# Patient Record
Sex: Female | Born: 1950 | Race: White | Hispanic: No | Marital: Married | Smoking: Former smoker
Health system: Southern US, Community
[De-identification: ages and names within clinical notes are randomized; demographics above are authoritative.]

## PROBLEM LIST (undated history)

## (undated) DIAGNOSIS — C189 Malignant neoplasm of colon, unspecified: Secondary | ICD-10-CM

## (undated) DIAGNOSIS — I10 Essential (primary) hypertension: Secondary | ICD-10-CM

## (undated) DIAGNOSIS — Z889 Allergy status to unspecified drugs, medicaments and biological substances status: Secondary | ICD-10-CM

## (undated) DIAGNOSIS — F419 Anxiety disorder, unspecified: Secondary | ICD-10-CM

## (undated) DIAGNOSIS — C801 Malignant (primary) neoplasm, unspecified: Secondary | ICD-10-CM

## (undated) DIAGNOSIS — N189 Chronic kidney disease, unspecified: Secondary | ICD-10-CM

## (undated) DIAGNOSIS — C786 Secondary malignant neoplasm of retroperitoneum and peritoneum: Secondary | ICD-10-CM

## (undated) DIAGNOSIS — M1990 Unspecified osteoarthritis, unspecified site: Secondary | ICD-10-CM

## (undated) DIAGNOSIS — R002 Palpitations: Secondary | ICD-10-CM

## (undated) DIAGNOSIS — K219 Gastro-esophageal reflux disease without esophagitis: Secondary | ICD-10-CM

## (undated) HISTORY — PX: BREAST SURGERY: SHX581

## (undated) HISTORY — PX: ABDOMINAL HYSTERECTOMY: SHX81

## (undated) HISTORY — DX: Malignant neoplasm of colon, unspecified: C18.9

---

## 2011-06-22 HISTORY — PX: APPENDECTOMY: SHX54

## 2011-08-18 ENCOUNTER — Other Ambulatory Visit (HOSPITAL_COMMUNITY): Payer: Self-pay | Admitting: Hematology & Oncology

## 2011-08-18 HISTORY — PX: COLON SURGERY: SHX602

## 2011-11-16 ENCOUNTER — Encounter: Payer: Self-pay | Admitting: Internal Medicine

## 2011-11-16 DIAGNOSIS — C189 Malignant neoplasm of colon, unspecified: Secondary | ICD-10-CM

## 2011-11-16 DIAGNOSIS — R109 Unspecified abdominal pain: Secondary | ICD-10-CM

## 2012-02-10 ENCOUNTER — Encounter: Payer: Self-pay | Admitting: Internal Medicine

## 2013-10-19 ENCOUNTER — Other Ambulatory Visit (HOSPITAL_COMMUNITY): Payer: Self-pay | Admitting: Family Medicine

## 2013-10-19 DIAGNOSIS — C189 Malignant neoplasm of colon, unspecified: Secondary | ICD-10-CM

## 2013-10-31 ENCOUNTER — Encounter (HOSPITAL_COMMUNITY)
Admission: RE | Admit: 2013-10-31 | Discharge: 2013-10-31 | Disposition: A | Discharge status: Home / Self Care | Payer: 59 | Source: Ambulatory Visit | Attending: Family Medicine | Admitting: Family Medicine

## 2013-10-31 DIAGNOSIS — C189 Malignant neoplasm of colon, unspecified: Secondary | ICD-10-CM | POA: Insufficient documentation

## 2013-10-31 LAB — GLUCOSE, CAPILLARY: GLUCOSE-CAPILLARY: 99 mg/dL (ref 70–99)

## 2013-10-31 MED ORDER — FLUDEOXYGLUCOSE F - 18 (FDG) INJECTION
11.4000 | Freq: Once | INTRAVENOUS | Status: AC | PRN
  Administered 2013-10-31: 11.4 via INTRAVENOUS

## 2013-11-20 ENCOUNTER — Encounter (HOSPITAL_COMMUNITY): Payer: Self-pay | Admitting: Pharmacy Technician

## 2013-11-20 DIAGNOSIS — Z889 Allergy status to unspecified drugs, medicaments and biological substances status: Secondary | ICD-10-CM

## 2013-11-20 HISTORY — DX: Allergy status to unspecified drugs, medicaments and biological substances: Z88.9

## 2013-11-20 NOTE — Progress Notes (Signed)
Need orders please - pt coming for preop WED 11/21/13 - thank you

## 2013-11-21 ENCOUNTER — Ambulatory Visit (HOSPITAL_COMMUNITY)
Admission: RE | Admit: 2013-11-21 | Discharge: 2013-11-21 | Disposition: A | Discharge status: Home / Self Care | Payer: 59 | Source: Ambulatory Visit | Attending: Anesthesiology | Admitting: Anesthesiology

## 2013-11-21 ENCOUNTER — Encounter (HOSPITAL_COMMUNITY): Payer: Self-pay

## 2013-11-21 ENCOUNTER — Encounter (INDEPENDENT_AMBULATORY_CARE_PROVIDER_SITE_OTHER): Payer: Self-pay | Admitting: Surgery

## 2013-11-21 ENCOUNTER — Other Ambulatory Visit (INDEPENDENT_AMBULATORY_CARE_PROVIDER_SITE_OTHER): Payer: Self-pay | Admitting: Surgery

## 2013-11-21 ENCOUNTER — Ambulatory Visit (INDEPENDENT_AMBULATORY_CARE_PROVIDER_SITE_OTHER): Payer: 59 | Admitting: Surgery

## 2013-11-21 ENCOUNTER — Encounter (INDEPENDENT_AMBULATORY_CARE_PROVIDER_SITE_OTHER): Payer: Self-pay

## 2013-11-21 ENCOUNTER — Encounter (HOSPITAL_COMMUNITY)
Admission: RE | Admit: 2013-11-21 | Discharge: 2013-11-21 | Disposition: A | Discharge status: Home / Self Care | Payer: 59 | Source: Ambulatory Visit | Attending: Surgery | Admitting: Surgery

## 2013-11-21 VITALS — BP 134/80 | HR 72 | Temp 97.4°F | Ht 62.0 in | Wt 146.0 lb

## 2013-11-21 DIAGNOSIS — C189 Malignant neoplasm of colon, unspecified: Secondary | ICD-10-CM

## 2013-11-21 HISTORY — DX: Anxiety disorder, unspecified: F41.9

## 2013-11-21 HISTORY — DX: Essential (primary) hypertension: I10

## 2013-11-21 HISTORY — DX: Chronic kidney disease, unspecified: N18.9

## 2013-11-21 HISTORY — PX: URETERAL STENT PLACEMENT: SHX822

## 2013-11-21 HISTORY — DX: Unspecified osteoarthritis, unspecified site: M19.90

## 2013-11-21 HISTORY — DX: Gastro-esophageal reflux disease without esophagitis: K21.9

## 2013-11-21 HISTORY — DX: Allergy status to unspecified drugs, medicaments and biological substances status: Z88.9

## 2013-11-21 HISTORY — DX: Palpitations: R00.2

## 2013-11-21 LAB — BASIC METABOLIC PANEL
BUN: 8 mg/dL (ref 6–23)
CALCIUM: 9.5 mg/dL (ref 8.4–10.5)
CO2: 26 meq/L (ref 19–32)
CREATININE: 1.02 mg/dL (ref 0.50–1.10)
Chloride: 102 mEq/L (ref 96–112)
GFR calc non Af Amer: 57 mL/min — ABNORMAL LOW (ref >90)
GFR, EST AFRICAN AMERICAN: 66 mL/min — AB (ref >90)
Glucose, Bld: 107 mg/dL — ABNORMAL HIGH (ref 70–99)
Potassium: 4.2 mEq/L (ref 3.7–5.3)
Sodium: 140 mEq/L (ref 137–147)

## 2013-11-21 LAB — CBC
HCT: 38.6 % (ref 36.0–46.0)
Hemoglobin: 12.1 g/dL (ref 12.0–15.0)
MCH: 25.2 pg — ABNORMAL LOW (ref 26.0–34.0)
MCHC: 31.3 g/dL (ref 30.0–36.0)
MCV: 80.2 fL (ref 78.0–100.0)
Platelets: 214 10*3/uL (ref 150–400)
RBC: 4.81 MIL/uL (ref 3.87–5.11)
RDW: 13.8 % (ref 11.5–15.5)
WBC: 5.8 10*3/uL (ref 4.0–10.5)

## 2013-11-21 NOTE — Progress Notes (Signed)
11-21-13  0830 Pt. Here for PAT appointment-need MD order entry in Epic. 

## 2013-11-21 NOTE — Patient Instructions (Signed)
Implanted Port Home Guide An implanted port is a type of central line that is placed under the skin. Central lines are used to provide IV access when treatment or nutrition needs to be given through a person's veins. Implanted ports are used for long-term IV access. An implanted port may be placed because:   You need IV medicine that would be irritating to the small veins in your hands or arms.   You need long-term IV medicines, such as antibiotics.   You need IV nutrition for a long period.   You need frequent blood draws for lab tests.   You need dialysis.  Implanted ports are usually placed in the chest area, but they can also be placed in the upper arm, the abdomen, or the leg. An implanted port has two main parts:   Reservoir. The reservoir is round and will appear as a small, raised area under your skin. The reservoir is the part where a needle is inserted to give medicines or draw blood.   Catheter. The catheter is a thin, flexible tube that extends from the reservoir. The catheter is placed into a large vein. Medicine that is inserted into the reservoir goes into the catheter and then into the vein.  HOW WILL I CARE FOR MY INCISION SITE? Do not get the incision site wet. Bathe or shower as directed by your health care provider.  HOW IS MY PORT ACCESSED? Special steps must be taken to access the port:   Before the port is accessed, a numbing cream can be placed on the skin. This helps numb the skin over the port site.   Your health care provider uses a sterile technique to access the port.  Your health care provider must put on a mask and sterile gloves.  The skin over your port is cleaned carefully with an antiseptic and allowed to dry.  The port is gently pinched between sterile gloves, and a needle is inserted into the port.  Only "non-coring" port needles should be used to access the port. Once the port is accessed, a blood return should be checked. This helps  ensure that the port is in the vein and is not clogged.   If your port needs to remain accessed for a constant infusion, a clear (transparent) bandage will be placed over the needle site. The bandage and needle will need to be changed every week, or as directed by your health care provider.   Keep the bandage covering the needle clean and dry. Do not get it wet. Follow your health care provider's instructions on how to take a shower or bath while the port is accessed.   If your port does not need to stay accessed, no bandage is needed over the port.  WHAT IS FLUSHING? Flushing helps keep the port from getting clogged. Follow your health care provider's instructions on how and when to flush the port. Ports are usually flushed with saline solution or a medicine called heparin. The need for flushing will depend on how the port is used.   If the port is used for intermittent medicines or blood draws, the port will need to be flushed:   After medicines have been given.   After blood has been drawn.   As part of routine maintenance.   If a constant infusion is running, the port may not need to be flushed.  HOW LONG WILL MY PORT STAY IMPLANTED? The port can stay in for as long as your health care   provider thinks it is needed. When it is time for the port to come out, surgery will be done to remove it. The procedure is similar to the one performed when the port was put in.  WHEN SHOULD I SEEK IMMEDIATE MEDICAL CARE? When you have an implanted port, you should seek immediate medical care if:   You notice a bad smell coming from the incision site.   You have swelling, redness, or drainage at the incision site.   You have more swelling or pain at the port site or the surrounding area.   You have a fever that is not controlled with medicine. Document Released: 06/07/2005 Document Revised: 03/28/2013 Document Reviewed: 02/12/2013 [facility] Patient Information 2014 [place].  PORT-A-CATHETER PLACEMENT:  INSTRUCTIONS AFTER SURGERY  DIET: Follow a light bland diet the first night after arrival home, such as soup, liquids, crackers, etc.  Be sure to include lots of fluids daily.  Avoid fast food or heavy meals as your are more likely to get nauseated.    Take your usually prescribed home medications unless otherwise directed.  PAIN CONTROL: Pain is best controlled by a usual combination of three different methods TOGETHER: Ice/Heat Over the counter pain medication Prescription pain medication  Most patients will experience some swelling and bruising around the incisions.  Ice packs or heating pads (30-60 minutes up to 6 times a day) will help. Use ice for the first few days to help decrease swelling and bruising, then switch to heat to help relax tight/sore spots and speed recovery.  Some people prefer to use ice alone, heat alone, alternating between ice & heat.  Experiment to what works for you.  Swelling and bruising can take several weeks to resolve.    It is helpful to take an over-the-counter pain medication regularly for the first few weeks.  Using acetaminophen (Tylenol, etc) 500-650mg  four times a day (every meal & bedtime) is usually safest since NSAIDs are not advisable in patients with kidney disease.  A  prescription for pain medication (such as oxycodone, hydrocodone, etc) may be given to you upon discharge.  Take your pain medication as prescribed.   If you are having problems/concerns with the prescription medicine (does not control pain, nausea, vomiting, rash, itching, etc), please call us [phone] to see if we need to switch you to a different pain medicine that will work better for you and/or control your side effect better. If you need a refill on your pain medication, please contact your pharmacy.  They will contact our office to request authorization. Prescriptions will not be filled after 5 pm or on week-ends.  Avoid getting  constipated.   Between the surgery and the pain medications, it is common to experience some constipation.  Increasing fluid intake and taking a fiber supplement (such as Metamucil, Citrucel, FiberCon, MiraLax, etc) 1-2 times a day regularly will usually help prevent this problem from occurring.  A mild laxative (prune juice, Milk of Magnesia, MiraLax, etc) should be taken according to package directions if there are no bowel movements after 48 hours.    Wash / shower every day.   You may shower over the dressings as they are waterproof.  Continue to shower over incision(s) after the dressing is off.  The Chemotherapy / Oncology nurse will remove your waterproof bandages in their office a few days after surgery.  Do not remove the bandages unless you are 5 days out from surgery  ACTIVITIES as tolerated:   You may  resume regular (light) daily activities beginning the next day-such as daily self-care, walking, climbing stairs-gradually increasing activities as tolerated.  If you can walk 30 minutes without difficulty, it is safe to try more intense activity such as jogging, treadmill, bicycling, low-impact aerobics, swimming, etc. Save the most intensive and strenuous activity for last such as push-ups, heavy lifting, contact sports, etc  Refrain from any heavy lifting or straining until you are off narcotics for pain control.    DO NOT PUSH THROUGH PAIN.  Let pain be your guide: If it hurts to do something, don't do it.  Pain is your body warning you to avoid that activity for another week until the pain goes down. You may drive when you are no longer taking prescription pain medication, you can comfortably wear a seatbelt, and you can safely maneuver your car and apply brakes. You may have sexual intercourse when it is comfortable.   FOLLOW UP with the Oncology / Chemotherapy nurses closely after surgery. Please call [facility] at [phone] only as needed.  The infusion nurses & Oncology usually  follow you closely, making the need for follow-up in our office redundant and therefore not needed.  If they or you have concerns, please call us for possible follow-up in our office 9. IF YOU HAVE DISABILITY OR FAMILY LEAVE FORMS, BRING THEM TO THE OFFICE FOR PROCESSING.  DO NOT GIVE THEM TO YOUR DOCTOR.   WHEN TO CALL us [phone]: Poor pain control Reactions / problems with new medications (rash/itching, nausea, etc)  Fever over 101.5 F (38.5 C) Worsening swelling or bruising Continued bleeding from incision. Increased pain, redness, or drainage from the incision   The clinic staff is available to answer your questions during regular business hours (8:30am-5pm).  Please don't hesitate to call and ask to speak to one of our nurses for clinical concerns.   If you have a medical emergency, go to the nearest emergency room or call 911.  A surgeon from [facility] is always on call at the [facility], [address], [place], [place], [place]  [address] ? MAIN: [phone] ? TOLL FREE: [phone] ?  FAX [phone] [email or web address]  Chemotherapy Many people are apprehensive about chemotherapy due to concerns over uncomfortable side effects. However, managements for side effects have come a long way. Many side effects once associated with chemotherapy can be prevented and/or controlled. WHAT IS CHEMOTHERAPY? Chemotherapy is the general term for any treatment involving the use of chemical agents. Chemotherapy can be given through a vein, most commonly through an implanted port* or PICC line.* It can also be delivered by mouth (orally) in the form of a pill. The main goal of chemotherapy is to kill cancer cells and stop them from growing. It can destroy and eliminate cancer cells where the cancer started (primary tumor location) and throughout the body, often far away from the original cancer. It is a treatment that not  only targets the original cancer location, but also the entire body (systemic treatment) for full effect and results. Chemotherapy works by destroying cancer cells. Unfortunately, it cannot tell the difference between a cancer cell and some healthy cells. This results in the death of noncancerous cells, such as hair and blood cells. Harm to healthy cells is what causes side effects. These cells usually repair themselves after chemotherapy. Because some drugs work better together rather than alone, 2 or more drugs are often given at the same time.  This is called combination chemotherapy. Depending on the type of cancer and how advanced it is, chemotherapy can be used for different goals:  Cure the cancer.  Keep the cancer from spreading.  Slow the cancer's growth.  Kill cancer cells that may have spread to other parts of the body from the original tumor.  Relieve symptoms caused by cancer. You and your caregiver will decide what drug or combination of drugs you will get. Your caregiver will choose the doses, how the drugs will be given, how often, and how long you will get treatment. All of these decisions will depend on the type of cancer, where it is, how big it is, and how it is affecting your normal body functions and overall health. *Implanted port - A device that is implanted under your skin so that medicines may be delivered directly into your blood system. *PICC line (peripherally inserted central catheter) - A long, slender, flexible tube. This tube is often inserted into a vein, typically in the upper arm. The tip stops in the large central vein that leads to your heart. Document Released: 04/04/2007 Document Revised: 08/30/2011 Document Reviewed: 09/19/2008 [facility] Patient Information 2014 [place], [place].  Colorectal Cancer Colorectal cancer is an abnormal growth of tissue (tumor) in the colon or rectum that is cancerous (malignant). Unlike noncancerous (benign) tumors, malignant  tumors can spread to other parts of your body. The colon is the large bowel or large intestine. The rectum is the last several inches of the colon.  RISK FACTORS The exact cause of colorectal cancer is unknown. However, the following factors may increase your chances of getting colorectal cancer:   Age older than 55 years.   Abnormal growths (polyps) on the inner wall of the colon or rectum.   Diabetes.   African American race.   Family history of hereditary nonpolyposis colorectal cancer. This condition is caused by changes in the genes that are responsible for repairing mismatched DNA.   Personal history of cancer. A person who has already had colorectal cancer may develop it a second time. Also, women with a history of ovarian, uterine, or breast cancer are at a somewhat higher risk of developing colorectal cancer.  Certain hereditary conditions.  Eating a diet that is high in fat (especially animal fat) and low in fiber, fruits, and vegetables.  Sedentary lifestyle.  Inflammatory bowel disease, including ulcerative colitis and Crohn disease.   Smoking.   Excessive alcohol use.  SYMPTOMS Early colorectal cancer often does not cause symptoms. As the cancer grows, symptoms may include:   Changes in bowel habits.  Diarrhea.   Constipation.   Feeling like the bowel does not empty completely after a bowel movement.   Blood in the stool.   Stools that are narrower than usual.   Abdominal discomfort, pain, bloating, fullness, or cramps.  Frequent gas pain.   Unexplained weight loss.   Constant tiredness.   Nausea and vomiting.  DIAGNOSIS  Your health care provider will ask about your medical history. He or she may also perform a number of procedures, such as:   A physical exam.  A digital rectal exam.  A fecal occult blood test.  A barium enema.  Blood tests.   X-rays.   Imaging tests, such as CT scans or MRIs.   Taking a tissue  sample (biopsy) from your colon or rectum to look for cancer cells.   A sigmoidoscopy to view the inside of the last part of your colon.   A  colonoscopy to view the inside of your entire colon.   An endorectal ultrasound to see how deep a rectal tumor has grown and whether the cancer has spread to lymph nodes or other nearby tissues.  Your cancer will be staged to determine its severity and extent. Staging is a careful attempt to find out the size of the tumor, whether the cancer has spread, and if so, to what parts of the body. You may need to have more tests to determine the stage of your cancer. The test results will help determine what treatment plan is best for you.   Stage 0 The cancer is found only in the innermost lining of the colon or rectum.   Stage I The cancer has grown into the inner wall of the colon or rectum. The cancer has not yet reached the outer wall of the colon.   Stage II The cancer extends more deeply into or through the wall of the colon or rectum. It may have invaded nearby tissue, but cancer cells have not spread to the lymph nodes.   Stage III The cancer has spread to nearby lymph nodes but not to other parts of the body.   Stage IV The cancer has spread to other parts of the body, such as the liver or lungs.  Your health care provider may tell you the detailed stage of your cancer, which includes both a number and a letter.  TREATMENT  Depending on the type and stage, colorectal cancer may be treated with surgery, radiation therapy, chemotherapy, targeted therapy, or radiofrequency ablation. Some people have a combination of these therapies. Surgery may be done to remove the polyps from your colon. In early stages, your health care provider may be able to do this during a colonoscopy. In later stages, surgery may be done to remove part of your colon.  HOME CARE INSTRUCTIONS   Only take over-the-counter or prescription medicines for pain, discomfort, or  fever as directed by your health care provider.   Maintain a healthy diet.   Consider joining a support group. This may help you learn to cope with the stress of having colorectal cancer.   Seek advice to help you manage treatment of side effects.   Keep all follow-up appointments as directed by your health care provider.   Inform your cancer specialist if you are admitted to the hospital.  SEEK MEDICAL CARE IF:  Your diarrhea or constipation does not go away.   Your bowel habits change.  You have increased abdominal pain.   You notice new fatigue or weakness.  You lose weight. Document Released: 06/07/2005 Document Revised: 02/07/2013 Document Reviewed: 11/30/2012 [facility] Patient Information 2014 [place].

## 2013-11-21 NOTE — Patient Instructions (Addendum)
Julie Galvan  11/21/2013   Your procedure is scheduled on:   11-22-2013  Enter through [facility] and follow signs to [place]. Arrive at   0930     AM .  Call this number if you have problems the morning of surgery: [phone]  Or Presurgical Testing [phone](Rudolph Dobler) For Living Will and/or Health Care Power Attorney Forms: please provide copy for your medical record,may bring AM of surgery(Forms should be already notarized -we do not provide this service).(Yes. 11-21-13  information preferred today, and given packet).     Do not eat food:After Midnight.    Take these medicines the morning of surgery with A SIP OF WATER: Metoprolol. Hydrocodone. Xanax if needed.   Do not wear jewelry, make-up or nail polish.  Do not wear lotions, powders, or perfumes. You may wear deodorant.  Do not shave 48 hours(2 days) prior to first CHG shower(legs and under arms).(Shaving face and neck okay.)  Do not bring valuables to the hospital.(Hospital is not responsible for lost valuables).  Contacts, dentures or removable bridgework, body piercing, hair pins may not be worn into surgery.  Leave suitcase in the car. After surgery it may be brought to your room.  For patients admitted to the hospital, checkout time is 11:00 AM the day of discharge.(Restricted visitors-Any Persons displaying flu-like symptoms or illness).    Patients discharged the day of surgery will not be allowed to drive home. Must have responsible person with you x 24 hours once discharged.  Name and phone number of your driver: [name] cell  Special Instructions: CHG(Chlorhedine 4%-"Hibiclens","Betasept","Aplicare") Shower Use Special Wash: see special instructions.(avoid face and genitals)     Failure to follow these instructions may result in Cancellation of your surgery.  _____________________    [facility] - Preparing for Surgery Before surgery, you can play an important role.   Because skin is not sterile, your skin needs to be as free of germs as possible.  You can reduce the number of germs on your skin by washing with CHG (chlorahexidine gluconate) soap before surgery.  CHG is an antiseptic cleaner which kills germs and bonds with the skin to continue killing germs even after washing. Please DO NOT use if you have an allergy to CHG or antibacterial soaps.  If your skin becomes reddened/irritated stop using the CHG and inform your nurse when you arrive at Short Stay. Do not shave (including legs and underarms) for at least 48 hours prior to the first CHG shower.  You may shave your face/neck. Please follow these instructions carefully:  1.  Shower with CHG Soap the night before surgery and the  morning of Surgery.  2.  If you choose to wash your hair, wash your hair first as usual with your  normal  shampoo.  3.  After you shampoo, rinse your hair and body thoroughly to remove the  shampoo.                           4.  Use CHG as you would any other liquid soap.  You can apply chg directly  to the skin and wash                       Gently with a scrungie or clean washcloth.  5.  Apply the CHG Soap to your body ONLY FROM THE NECK DOWN.   Do not  use on face/ open                           Wound or open sores. Avoid contact with eyes, ears mouth and genitals (private parts).                       Wash face,  Genitals (private parts) with your normal soap.             6.  Wash thoroughly, paying special attention to the area where your surgery  will be performed.  7.  Thoroughly rinse your body with warm water from the neck down.  8.  DO NOT shower/wash with your normal soap after using and rinsing off  the CHG Soap.                9.  Pat yourself dry with a clean towel.            10.  Wear clean pajamas.            11.  Place clean sheets on your bed the night of your first shower and do not  sleep with pets. Day of Surgery : Do not apply any lotions/deodorants the  morning of surgery.  Please wear clean clothes to the hospital/surgery center.  FAILURE TO FOLLOW THESE INSTRUCTIONS MAY RESULT IN THE CANCELLATION OF YOUR SURGERY PATIENT SIGNATURE_________________________________  NURSE SIGNATURE__________________________________  ________________________________________________________________________

## 2013-11-21 NOTE — Progress Notes (Signed)
Subjective:     Patient ID: Julie Galvan, female   DOB: May 10, 1951, 63 y.o.   MRN: [id number]  HPI  Note: Portions of this report may have been transcribed using voice recognition software. Every effort was made to ensure accuracy; however, inadvertent computerized transcription errors may be present.   Any transcriptional errors that result from this process are unintentional.            PRISCILLE SHADDUCK  [birth year] [id number]  Patient Care Team: Caryl Bis, MD as PCP - General (Family Medicine) Milus Height, MD as Consulting Physician (Medical Oncology)  This patient is a 63 y.o.female who presents today for surgical evaluation at the request of Dr. Jacquiline Doe.   Reason for visit: Metastatic colon cancer.  Need for chemotherapy.  Pleasant female.  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  Underwent open right colectomy 2013.  Has developed cancer recurrence in the pelvis.  Felt to benefit from chemotherapy.  The patient requested Port-A-Cath to be placed in [place].  Surgical consultation requested.  She is right-handed.  No history of prior central lines.  No history of MRSA or infections.  Hs some seasonal sinusitis with a mild cough but nothing severe.  No purulence.  Quit smoking 2 months ago.  Patient Active Problem List   Diagnosis Date Noted  . Colon cancer metastasized to multiple sites     Past Medical History  Diagnosis Date  . Hypertension   . Heart palpitations     intermittent" especially if forgets to take Metoprolol."  . Anxiety   . H/O seasonal allergies 11-20-13    "causes coughing"  . Chronic kidney disease     hx. of kidney stones and right ureteral stent placed due to mestatic cancer  . GERD (gastroesophageal reflux disease)   . Arthritis     fingers  . Colon cancer metastasized to multiple sites     hx. colon cancer,11-21-13 with new metatasis to ureterer-right    Past Surgical History  Procedure Laterality Date  . Ureteral stent Right 11-21-13   done at [facility]  . Appendectomy      1'13 dx. cancer  . Colon surgery      colon resection for cancer and agin for obstruction  . Abdominal hysterectomy    . Breast surgery      left breast lumpectomy-benign-40 yrs ago    History   Social History  . Marital Status: Married    Spouse Name: N/A    Number of Children: N/A  . Years of Education: N/A   Occupational History  . Not on file.   Social History Main Topics  . Smoking status: Former Smoker    Types: Cigarettes    Quit date: 09/21/2013  . Smokeless tobacco: Not on file  . Alcohol Use: No  . Drug Use: No  . Sexual Activity: Not on file   Other Topics Concern  . Not on file   Social History Narrative  . No narrative on file    Family History  Problem Relation Age of Onset  . Cancer Mother   . Stroke Father     Current Outpatient Prescriptions  Medication Sig Dispense Refill  . ALPRAZolam (XANAX) 0.25 MG tablet Take 0.25 mg by mouth 3 (three) times daily as needed for anxiety.      . Calcium Carbonate-Vit D-Min (CALTRATE 600+D PLUS MINERALS) 600-800 MG-UNIT CHEW Chew 1 tablet by mouth every other day.      . diphenhydrAMINE (BENADRYL) 25  mg capsule Take 25 mg by mouth every 6 (six) hours as needed for itching or allergies.      . Hydrocodone-Acetaminophen 5-300 MG TABS Take 1 tablet by mouth every 6 (six) hours as needed (pain).      . metoprolol succinate (TOPROL-XL) 25 MG 24 hr tablet Take 25 mg by mouth 2 (two) times daily.      . naproxen sodium (ANAPROX) 220 MG tablet Take 220 mg by mouth 2 (two) times daily as needed (pain).       No current facility-administered medications for this visit.     Allergies  Allergen Reactions  . Sulfa Antibiotics Hives and Other (See Comments)    Headache     BP 134/80  Pulse 72  Temp(Src) 97.4 F (36.3 C)  Ht 5\' 2"  (1.575 m)  Wt 146 lb (66.225 kg)  BMI 26.70 kg/m2  Dg Chest 2 View  11/21/2013   CLINICAL DATA:  Preoperative Port-A-Cath placement; colon  carcinoma  EXAM: CHEST  2 VIEW  COMPARISON:  Chest radiograph January 01, 2006 and PET-CT Oct 31, 2013  FINDINGS: There is slight scarring in the left base. There is no edema or consolidation. Heart size and pulmonary vascularity are normal. No adenopathy. No bone lesions.  IMPRESSION: No edema or consolidation.   Electronically Signed   By: Lowella Grip M.D.   On: 11/21/2013 09:49   Nm Pet Image Restag (ps) Skull Base To Thigh  10/31/2013   CLINICAL DATA:  Subsequent treatment strategy for colon cancer.  EXAM: NUCLEAR MEDICINE PET SKULL BASE TO THIGH  TECHNIQUE: 11.4 mCi F-18 FDG was injected intravenously. Full-ring PET imaging was performed from the skull base to thigh after the radiotracer. CT data was obtained and used for attenuation correction and anatomic localization.  FASTING BLOOD GLUCOSE:  Value: 99 mg/dl  COMPARISON:  CT abdomen and pelvis from 10/15/2013.  FINDINGS: NECK  No hypermetabolic lymph nodes in the neck.  CHEST  No hypermetabolic mediastinal or hilar nodes. No suspicious pulmonary nodules on the CT scan.  ABDOMEN/PELVIS  No abnormal hypermetabolic activity within the liver, pancreas, adrenal glands, or spleen. No hypermetabolic lymph nodes in the abdomen or pelvis. 3.0 cm low-density lesion in the left liver is not hypermetabolic, compatible with previous characterization as benign hemangioma.  1.6 cm irregular soft tissue nodule in the ileocolic mesentery is hypermetabolic with SUV max = 5. 7 mm nodule in the anterior right lower quadrant seen on image 140 of the CT images is also hypermetabolic. 10 mm irregular presacral nodule seen on image 176 is hypermetabolic with SUV max = 4.7. The mixed cystic and solid lesions seen in the right pelvis on the recent CT scan show hypermetabolism in the region where it involves the distal right ureter. Double-J right internal ureteral stent is new in the interval and right hydronephrosis is largely resolved. The mixed cystic and solid lesion in the  left anatomic pelvis, towards the adnexal region, has a 2.2 x 3.3 cm nodular focus (seen on image 162 of series 4 today) which is hypermetabolic with SUV max = 8.3  SKELETON  No focal hypermetabolic activity to suggest skeletal metastasis.  IMPRESSION: Multiple hypermetabolic soft tissue lesions scattered through the lower mesentery and bilateral pelvis, consistent with metastatic disease.  Interval resolution of right hydronephrosis secondary to ureteral stent placement.   Electronically Signed   By: Misty Stanley M.D.   On: 10/31/2013 10:11     Review of Systems  Constitutional: Negative for fever,  chills, diaphoresis, appetite change and fatigue.  HENT: Negative for ear discharge, ear pain, sore throat and trouble swallowing.   Eyes: Negative for photophobia, discharge and visual disturbance.  Respiratory: Negative for cough, choking, chest tightness and shortness of breath.   Cardiovascular: Negative for chest pain and palpitations.  Gastrointestinal: Negative for nausea, vomiting, abdominal pain, diarrhea, constipation, anal bleeding and rectal pain.  Endocrine: Negative for cold intolerance and heat intolerance.  Genitourinary: Negative for dysuria, frequency and difficulty urinating.  Musculoskeletal: Negative for gait problem, myalgias and neck pain.  Skin: Negative for color change, pallor and rash.  Allergic/Immunologic: Negative for environmental allergies, food allergies and immunocompromised state.  Neurological: Negative for dizziness, speech difficulty, weakness and numbness.  Hematological: Negative for adenopathy.  Psychiatric/Behavioral: Negative for confusion and agitation. The patient is not nervous/anxious.        Objective:   Physical Exam  Constitutional: She is oriented to person, place, and time. She appears well-developed and well-nourished. No distress.  HENT:  Head: Normocephalic.  Mouth/Throat: Oropharynx is clear and moist. No oropharyngeal exudate.  Eyes:  Conjunctivae and EOM are normal. Pupils are equal, round, and reactive to light. No scleral icterus.  Neck: Normal range of motion. Neck supple. No tracheal deviation present.  Cardiovascular: Normal rate, regular rhythm and intact distal pulses.   Pulmonary/Chest: Effort normal and breath sounds normal. No stridor. No respiratory distress. She exhibits no tenderness.  Abdominal: Soft. She exhibits no distension and no mass. There is no tenderness. No hernia. Hernia confirmed negative in the ventral area, confirmed negative in the right inguinal area and confirmed negative in the left inguinal area.    Genitourinary: No vaginal discharge found.  Musculoskeletal: Normal range of motion. She exhibits no tenderness.       Right elbow: She exhibits normal range of motion.       Left elbow: She exhibits normal range of motion.       Right wrist: She exhibits normal range of motion.       Left wrist: She exhibits normal range of motion.       Right hand: Normal strength noted.       Left hand: Normal strength noted.  Lymphadenopathy:       Head (right side): No posterior auricular adenopathy present.       Head (left side): No posterior auricular adenopathy present.    She has no cervical adenopathy.    She has no axillary adenopathy.       Right: No inguinal adenopathy present.       Left: No inguinal adenopathy present.  Neurological: She is alert and oriented to person, place, and time. No cranial nerve deficit. She exhibits normal muscle tone. Coordination normal.  Skin: Skin is warm and dry. No rash noted. She is not diaphoretic. No erythema.  Psychiatric: She has a normal mood and affect. Her behavior is normal. Judgment and thought content normal.       Assessment:     Cancer of proximal colon found incidentally an appendix now with evidence of metastatic disease and pelvis.  Consideration of chemotherapy.  Request for portacatheter     Plan:     I spent some time discussing  indications for portacatheter.  Technique was discussed.  They had many questions about chemotherapy and recovery.  I stressed that the chemotherapy nurses and medical oncologist will put him through this, but we did provide information with this as well.  I spent almost an hour discussing this  with them after reviewing history and physical  Use of a central venous catheter for intravenous therapy was discussed.  Technique of catheter placement using ultrasound and fluoroscopy guidance was discussed.  Risks such as bleeding, infection, pneumothorax, catheter occlusion, reoperation, and other risks were discussed.   I noted a good likelihood this will help address the problem.  Questions were answered.  The patient expressed understanding & wishes to proceed.  My partner, Dr. Lucia Gaskins, is available to do his procedure tomorrow.  We will set this up.

## 2013-11-22 ENCOUNTER — Encounter (HOSPITAL_COMMUNITY)
Admission: RE | Disposition: A | Discharge status: Home / Self Care | Payer: Self-pay | Source: Ambulatory Visit | Attending: Surgery

## 2013-11-22 ENCOUNTER — Ambulatory Visit (HOSPITAL_COMMUNITY)
Admission: RE | Admit: 2013-11-22 | Discharge: 2013-11-22 | Disposition: A | Discharge status: Home / Self Care | Payer: 59 | Source: Ambulatory Visit | Attending: Surgery | Admitting: Surgery

## 2013-11-22 ENCOUNTER — Ambulatory Visit (HOSPITAL_COMMUNITY): Payer: 59 | Admitting: Anesthesiology

## 2013-11-22 ENCOUNTER — Encounter (INDEPENDENT_AMBULATORY_CARE_PROVIDER_SITE_OTHER): Payer: Self-pay

## 2013-11-22 ENCOUNTER — Encounter (HOSPITAL_COMMUNITY): Payer: Self-pay | Admitting: *Deleted

## 2013-11-22 ENCOUNTER — Encounter (HOSPITAL_COMMUNITY): Payer: 59 | Admitting: Anesthesiology

## 2013-11-22 ENCOUNTER — Ambulatory Visit (HOSPITAL_COMMUNITY): Payer: 59

## 2013-11-22 DIAGNOSIS — C801 Malignant (primary) neoplasm, unspecified: Secondary | ICD-10-CM | POA: Insufficient documentation

## 2013-11-22 DIAGNOSIS — C189 Malignant neoplasm of colon, unspecified: Secondary | ICD-10-CM

## 2013-11-22 DIAGNOSIS — Z87891 Personal history of nicotine dependence: Secondary | ICD-10-CM | POA: Insufficient documentation

## 2013-11-22 DIAGNOSIS — I10 Essential (primary) hypertension: Secondary | ICD-10-CM | POA: Insufficient documentation

## 2013-11-22 HISTORY — PX: PORTACATH PLACEMENT: SHX2246

## 2013-11-22 SURGERY — INSERTION, TUNNELED CENTRAL VENOUS DEVICE, WITH PORT
Anesthesia: General | Site: Neck

## 2013-11-22 MED ORDER — HEPARIN SOD (PORK) LOCK FLUSH 100 UNIT/ML IV SOLN
INTRAVENOUS | Status: AC
  Filled 2013-11-22: qty 5

## 2013-11-22 MED ORDER — DEXAMETHASONE SODIUM PHOSPHATE 10 MG/ML IJ SOLN
INTRAMUSCULAR | Status: AC
  Filled 2013-11-22: qty 1

## 2013-11-22 MED ORDER — CEFAZOLIN SODIUM-DEXTROSE 2-3 GM-% IV SOLR
INTRAVENOUS | Status: AC
  Filled 2013-11-22: qty 50

## 2013-11-22 MED ORDER — LACTATED RINGERS IV SOLN
INTRAVENOUS | Status: DC
  Administered 2013-11-22: 1000 mL via INTRAVENOUS

## 2013-11-22 MED ORDER — SODIUM CHLORIDE 0.9 % IR SOLN
Freq: Once | Status: DC
  Filled 2013-11-22: qty 1.2

## 2013-11-22 MED ORDER — LIDOCAINE HCL (CARDIAC) 20 MG/ML IV SOLN
INTRAVENOUS | Status: AC
Start: 2013-11-22 — End: 2013-11-22
  Filled 2013-11-22: qty 5

## 2013-11-22 MED ORDER — LIDOCAINE HCL (PF) 1 % IJ SOLN
INTRAMUSCULAR | Status: DC | PRN
  Administered 2013-11-22: 10 mL

## 2013-11-22 MED ORDER — HEPARIN SOD (PORK) LOCK FLUSH 100 UNIT/ML IV SOLN
INTRAVENOUS | Status: DC | PRN
  Administered 2013-11-22: 400 [IU] via INTRAVENOUS

## 2013-11-22 MED ORDER — FENTANYL CITRATE 0.05 MG/ML IJ SOLN
25.0000 ug | INTRAMUSCULAR | Status: DC | PRN
  Administered 2013-11-22 (×2): 50 ug via INTRAVENOUS

## 2013-11-22 MED ORDER — ONDANSETRON HCL 4 MG/2ML IJ SOLN
INTRAMUSCULAR | Status: DC | PRN
  Administered 2013-11-22: 4 mg via INTRAVENOUS

## 2013-11-22 MED ORDER — MIDAZOLAM HCL 2 MG/2ML IJ SOLN
INTRAMUSCULAR | Status: AC
  Filled 2013-11-22: qty 2

## 2013-11-22 MED ORDER — LIDOCAINE HCL 1 % IJ SOLN
INTRAMUSCULAR | Status: AC
Start: 2013-11-22 — End: 2013-11-22
  Filled 2013-11-22: qty 20

## 2013-11-22 MED ORDER — PROPOFOL 10 MG/ML IV BOLUS
INTRAVENOUS | Status: DC | PRN
Start: 2013-11-22 — End: 2013-11-22
  Administered 2013-11-22: 150 mg via INTRAVENOUS

## 2013-11-22 MED ORDER — MIDAZOLAM HCL 5 MG/5ML IJ SOLN
INTRAMUSCULAR | Status: DC | PRN
Start: 2013-11-22 — End: 2013-11-22
  Administered 2013-11-22: 2 mg via INTRAVENOUS

## 2013-11-22 MED ORDER — FENTANYL CITRATE 0.05 MG/ML IJ SOLN
INTRAMUSCULAR | Status: AC
  Filled 2013-11-22: qty 2

## 2013-11-22 MED ORDER — KETOROLAC TROMETHAMINE 30 MG/ML IJ SOLN: 15.0000 mg | Freq: Once | INTRAMUSCULAR | Status: DC | PRN

## 2013-11-22 MED ORDER — ONDANSETRON HCL 4 MG/2ML IJ SOLN
INTRAMUSCULAR | Status: AC
  Filled 2013-11-22: qty 2

## 2013-11-22 MED ORDER — DEXAMETHASONE SODIUM PHOSPHATE 10 MG/ML IJ SOLN
INTRAMUSCULAR | Status: DC | PRN
  Administered 2013-11-22: 10 mg via INTRAVENOUS

## 2013-11-22 MED ORDER — LIDOCAINE HCL (CARDIAC) 20 MG/ML IV SOLN
INTRAVENOUS | Status: DC | PRN
  Administered 2013-11-22: 50 mg via INTRAVENOUS

## 2013-11-22 MED ORDER — SODIUM CHLORIDE 0.9 % IR SOLN
Status: DC | PRN
  Administered 2013-11-22: 12:00:00

## 2013-11-22 MED ORDER — PROMETHAZINE HCL 25 MG/ML IJ SOLN: 6.2500 mg | INTRAMUSCULAR | Status: DC | PRN

## 2013-11-22 MED ORDER — CEFAZOLIN SODIUM-DEXTROSE 2-3 GM-% IV SOLR
2.0000 g | INTRAVENOUS | Status: AC
  Administered 2013-11-22: 2 g via INTRAVENOUS

## 2013-11-22 MED ORDER — PROPOFOL 10 MG/ML IV BOLUS
INTRAVENOUS | Status: AC
  Filled 2013-11-22: qty 20

## 2013-11-22 MED ORDER — FENTANYL CITRATE 0.05 MG/ML IJ SOLN
INTRAMUSCULAR | Status: DC | PRN
  Administered 2013-11-22 (×2): 50 ug via INTRAVENOUS

## 2013-11-22 MED ORDER — CHLORHEXIDINE GLUCONATE 4 % EX LIQD: 1.0000 "application " | Freq: Once | CUTANEOUS | Status: DC

## 2013-11-22 MED ORDER — LACTATED RINGERS IV SOLN
INTRAVENOUS | Status: DC
  Administered 2013-11-22: 13:00:00 via INTRAVENOUS

## 2013-11-22 MED ORDER — HYDROCODONE-ACETAMINOPHEN 5-300 MG PO TABS: 1.0000 | ORAL_TABLET | Freq: Four times a day (QID) | ORAL | Status: DC | PRN

## 2013-11-22 SURGICAL SUPPLY — 37 items
BAG DECANTER FOR FLEXI CONT (MISCELLANEOUS) ×3 IMPLANT
BENZOIN TINCTURE PRP APPL 2/3 (GAUZE/BANDAGES/DRESSINGS) ×3 IMPLANT
BLADE HEX COATED 2.75 (ELECTRODE) ×3 IMPLANT
BLADE SURG 15 STRL LF DISP TIS (BLADE) ×1 IMPLANT
BLADE SURG 15 STRL SS (BLADE) ×2
CHLORAPREP W/TINT 10.5 ML (MISCELLANEOUS) ×3 IMPLANT
CLOSURE WOUND 1/2 X4 (GAUZE/BANDAGES/DRESSINGS) ×1
CLOSURE WOUND 1/4X4 (GAUZE/BANDAGES/DRESSINGS) ×1
DECANTER SPIKE VIAL GLASS SM (MISCELLANEOUS) ×6 IMPLANT
DRAPE C-ARM 42X120 X-RAY (DRAPES) ×3 IMPLANT
DRAPE LAPAROTOMY TRNSV 102X78 (DRAPE) ×3 IMPLANT
ELECT REM PT RETURN 9FT ADLT (ELECTROSURGICAL) ×3
ELECTRODE REM PT RTRN 9FT ADLT (ELECTROSURGICAL) ×1 IMPLANT
GAUZE SPONGE 2X2 8PLY STRL LF (GAUZE/BANDAGES/DRESSINGS) ×1 IMPLANT
GAUZE SPONGE 4X4 16PLY XRAY LF (GAUZE/BANDAGES/DRESSINGS) ×3 IMPLANT
GLOVE SURG SIGNA 7.5 PF LTX (GLOVE) ×3 IMPLANT
GOWN SPEC L4 XLG W/TWL (GOWN DISPOSABLE) ×3 IMPLANT
GOWN STRL REUS W/ TWL XL LVL3 (GOWN DISPOSABLE) ×3 IMPLANT
GOWN STRL REUS W/TWL XL LVL3 (GOWN DISPOSABLE) ×6
KIT BASIN OR (CUSTOM PROCEDURE TRAY) ×3 IMPLANT
KIT PORT POWER 8FR ISP CVUE (Catheter) ×3 IMPLANT
KIT POWER CATH 8FR (Catheter) IMPLANT
NEEDLE HYPO 25X1 1.5 SAFETY (NEEDLE) ×3 IMPLANT
NS IRRIG 1000ML POUR BTL (IV SOLUTION) ×3 IMPLANT
PACK BASIC VI WITH GOWN DISP (CUSTOM PROCEDURE TRAY) ×3 IMPLANT
PENCIL BUTTON HOLSTER BLD 10FT (ELECTRODE) ×3 IMPLANT
SPONGE GAUZE 2X2 STER 10/PKG (GAUZE/BANDAGES/DRESSINGS) ×2
SPONGE GAUZE 4X4 12PLY (GAUZE/BANDAGES/DRESSINGS) ×3 IMPLANT
STRIP CLOSURE SKIN 1/2X4 (GAUZE/BANDAGES/DRESSINGS) ×2 IMPLANT
STRIP CLOSURE SKIN 1/4X4 (GAUZE/BANDAGES/DRESSINGS) ×2 IMPLANT
SUT VIC AB 3-0 SH 18 (SUTURE) ×3 IMPLANT
SUT VIC AB 5-0 PS2 18 (SUTURE) ×3 IMPLANT
SYR 20CC LL (SYRINGE) ×3 IMPLANT
SYR BULB IRRIGATION 50ML (SYRINGE) IMPLANT
SYRINGE 10CC LL (SYRINGE) ×3 IMPLANT
TAPE CLOTH SURG 4X10 WHT LF (GAUZE/BANDAGES/DRESSINGS) ×3 IMPLANT
TOWEL OR 17X26 10 PK STRL BLUE (TOWEL DISPOSABLE) ×3 IMPLANT

## 2013-11-22 NOTE — Anesthesia Postprocedure Evaluation (Signed)
  Anesthesia Post-op Note  Patient: Julie Galvan  Procedure(s) Performed: Procedure(s) (LRB): INSERTION PORT-A-CATH LEFT SUBCLAVIAN VEIN (N/A)  Patient Location: PACU  Anesthesia Type: General  Level of Consciousness: awake and alert   Airway and Oxygen Therapy: Patient Spontanous Breathing  Post-op Pain: mild  Post-op Assessment: Post-op Vital signs reviewed, Patient's Cardiovascular Status Stable, Respiratory Function Stable, Patent Airway and No signs of Nausea or vomiting  Last Vitals:  Filed Vitals:   11/22/13 1215  BP: 147/80  Pulse: 82  Temp:   Resp: 15    Post-op Vital Signs: stable   Complications: No apparent anesthesia complications

## 2013-11-22 NOTE — Interval H&P Note (Signed)
History and Physical Interval Note:  11/22/2013 10:40 AM  Julie Galvan  has presented today for surgery, with the diagnosis of colon cancer  The various methods of treatment have been discussed with the patient and family. To facilitate the port being placed, she was seen by my partner, Dr. Johney Maine, in the office.  Her son is with her.  She has trouble laying flat - not clear why.  She sleeps on multiple pillows at home, but done not have significant cardiac issues.  I discussed potential risks of surgery, including pneumothorax.  After consideration of risks, benefits and other options for treatment, the patient has consented to  Procedure(s): INSERTION PORT-A-CATH (N/A) as a surgical intervention .  The patient's history has been reviewed, patient examined, no change in status, stable for surgery.  I have reviewed the patient's chart and labs.  Questions were answered to the patient's satisfaction.     Shann Medal

## 2013-11-22 NOTE — Progress Notes (Signed)
Patient has sinus drainage this AM causing her to cough

## 2013-11-22 NOTE — Progress Notes (Signed)
X-ray results noted 

## 2013-11-22 NOTE — Progress Notes (Signed)
Portable Upright Chest X-ray done. 

## 2013-11-22 NOTE — Anesthesia Preprocedure Evaluation (Signed)
Anesthesia Evaluation  Patient identified by MRN, date of birth, ID band Patient awake  General Assessment Comment:Colon cancer metastasized to multiple sites   Cecum [id number]   Reviewed: Allergy & Precautions, H&P , NPO status , Patient's Chart, lab work & pertinent test results  Airway Mallampati: II TM Distance: >3 FB Neck ROM: Full    Dental no notable dental hx.    Pulmonary neg pulmonary ROS, former smoker,  breath sounds clear to auscultation  Pulmonary exam normal       Cardiovascular hypertension, Pt. on medications and Pt. on home beta blockers Rhythm:Regular Rate:Normal     Neuro/Psych negative neurological ROS  negative psych ROS   GI/Hepatic negative GI ROS, Neg liver ROS,   Endo/Other  negative endocrine ROS  Renal/GU negative Renal ROS  negative genitourinary   Musculoskeletal negative musculoskeletal ROS (+)   Abdominal   Peds negative pediatric ROS (+)  Hematology negative hematology ROS (+)   Anesthesia Other Findings   Reproductive/Obstetrics negative OB ROS                           Anesthesia Physical Anesthesia Plan  ASA: III  Anesthesia Plan: General   Post-op Pain Management:    Induction: Intravenous  Airway Management Planned: LMA  Additional Equipment:   Intra-op Plan:   Post-operative Plan:   Informed Consent: I have reviewed the patients History and Physical, chart, labs and discussed the procedure including the risks, benefits and alternatives for the proposed anesthesia with the patient or authorized representative who has indicated his/her understanding and acceptance.   Dental advisory given  Plan Discussed with: CRNA and Surgeon  Anesthesia Plan Comments:         Anesthesia Quick Evaluation

## 2013-11-22 NOTE — Discharge Instructions (Signed)
[facility] [place] [facility] - DISCHARGE INSTRUCTIONS TO PATIENT  Activity:  Driving - May drive tomorrow, if doing well   Lifting - No lifting more than 15 pounds for 5 days, then no limit  Wound Care:   Leave dressing for 2 days, then may shower  Diet:  As tolerated  Follow up appointment:  Call Dr. Pollie Friar office [facility]) at [phone] for an appointment in 3 to 4 weeks.  Medications and dosages:  Resume your home medications.  You have a prescription for:  Vicodin  Call Dr. Lucia Gaskins or his office  [phone]) if you have:  Temperature greater than 100.4,  Persistent nausea and vomiting,  Severe uncontrolled pain,  Redness, tenderness, or signs of infection (pain, swelling, redness, odor or green/yellow discharge around the site),  Difficulty breathing, headache or visual disturbances,  Any other questions or concerns you may have after discharge.  In an emergency, call 911 or go to an Emergency Department at a nearby hospital.

## 2013-11-22 NOTE — H&P (View-Only) (Signed)
11-21-13  0830 Pt. Here for PAT appointment-need MD order entry in [place].

## 2013-11-22 NOTE — Transfer of Care (Signed)
Immediate Anesthesia Transfer of Care Note  Patient: Julie Galvan  Procedure(s) Performed: Procedure(s): INSERTION PORT-A-CATH LEFT SUBCLAVIAN VEIN (N/A)  Patient Location: PACU  Anesthesia Type:General  Level of Consciousness: sedated  Airway & Oxygen Therapy: Patient Spontanous Breathing and Patient connected to face mask oxygen  Post-op Assessment: Report given to PACU RN and Post -op Vital signs reviewed and stable  Post vital signs: Reviewed and stable  Complications: No apparent anesthesia complications

## 2013-11-22 NOTE — Op Note (Signed)
11/22/2013  11:50 AM  PATIENT:  Julie Galvan, 63 y.o., female MRN: [id number] DOB: [birth year]  PREOP DIAGNOSIS:  colon cancer, anticipate chemotherapy  POSTOP DIAGNOSIS:   Metastatic colon cancer  , anticipate chemotherapy  PROCEDURE:   Procedure(s):  INSERTION PORT-A-CATH LEFT SUBCLAVIAN VEIN (Power port clear view)  SURGEON:   Alphonsa Overall, M.D.  ANESTHESIA:   general  Anesthesiologist: Myrtie Soman, MD CRNA: Anne Fu, CRNA; Lind Covert, CRNA  General  EBL:  minimal  ml  COUNTS CORRECT:  YES  INDICATIONS FOR PROCEDURE:  Julie Galvan is a 63 y.o. (DOB: August 24, 1950) white female whose primary care physician is DANIEL, Coralyn Mark, MD and comes for power port placement for the treatment of metastatic colon cancer.  Dr. Jacquiline Doe is her treating oncologist.   The indications and risks of the surgery were explained to the patient.  The risks include, but are not limited to, infection, bleeding, pneumothorax, nerve injury, and thrombosis of the vein.  OPERATIVE NOTE:  The patient was taken to Room #6 at [facility].  Anesthesia was provided by Anesthesiologist: Myrtie Soman, MD CRNA: Anne Fu, CRNA; Lind Covert, CRNA.  At the beginning of the operation, the patient was given 2 gm Ancef, had a roll placed under her back, and had the upper chest/neck prepped with Chloroprep and draped.   A time out was held and the surgery checklist reviewed.   The patient was placed in Trendelenburg position.  The left subclavian vein was accessed with a 16 gauge needle and a guide wire threaded through the needle into the vein.  The position of the wire was checked with fluoroscopy.   I then developed a pocket in the upper inner aspect of the left chest for the port reservoir.  I used the [employer] for venous access.  The reservoir was sewn in place with a 3-0 Vicryl suture.  The reservoir had been flushed with dilute (10 units/cc) heparin.   I then passed the silastic tubing  from the reservoir incision to the subclavian stick site and used the 8 French introducer to pass it into the vein.  The tip of the silastic catheter was position at the junction of the SVC and the right atrium under fluoroscopy.  The silastic catheter was then attached to the port with the bayonet device.     The entire port and tubing were checked with fluoroscopy and then the port was flushed with 4 cc of concentrated heparin (100 units/cc).   The wounds were then closed with 3-0 vicryl subcutaneous sutures and the skin closed with a 5-0 Monocryl suture.  The skin was painted with tincture of benzoin and steri-stripped.   The patient was transferred to the recovery room in good condition.  The sponge and needle count were correct at the end of the case.  A CXR is ordered for port placement and pending at the time of this note.  Alphonsa Overall, MD, [facility] Pager: [phone] Office phone:  [phone]

## 2013-11-23 ENCOUNTER — Encounter (HOSPITAL_COMMUNITY): Payer: Self-pay | Admitting: Surgery

## 2013-12-24 ENCOUNTER — Encounter (INDEPENDENT_AMBULATORY_CARE_PROVIDER_SITE_OTHER): Payer: Self-pay

## 2015-08-04 ENCOUNTER — Emergency Department (HOSPITAL_COMMUNITY): Payer: BLUE CROSS/BLUE SHIELD

## 2015-08-04 ENCOUNTER — Inpatient Hospital Stay (HOSPITAL_COMMUNITY)
Admission: EM | Admit: 2015-08-04 | Discharge: 2015-08-07 | DRG: 375 | Disposition: A | Discharge status: Home / Self Care | Payer: BLUE CROSS/BLUE SHIELD | Attending: Internal Medicine | Admitting: Internal Medicine

## 2015-08-04 ENCOUNTER — Encounter (HOSPITAL_COMMUNITY): Payer: Self-pay | Admitting: *Deleted

## 2015-08-04 DIAGNOSIS — I129 Hypertensive chronic kidney disease with stage 1 through stage 4 chronic kidney disease, or unspecified chronic kidney disease: Secondary | ICD-10-CM | POA: Diagnosis present

## 2015-08-04 DIAGNOSIS — Z87442 Personal history of urinary calculi: Secondary | ICD-10-CM

## 2015-08-04 DIAGNOSIS — K529 Noninfective gastroenteritis and colitis, unspecified: Secondary | ICD-10-CM | POA: Insufficient documentation

## 2015-08-04 DIAGNOSIS — C19 Malignant neoplasm of rectosigmoid junction: Secondary | ICD-10-CM

## 2015-08-04 DIAGNOSIS — R599 Enlarged lymph nodes, unspecified: Secondary | ICD-10-CM | POA: Diagnosis not present

## 2015-08-04 DIAGNOSIS — C181 Malignant neoplasm of appendix: Secondary | ICD-10-CM | POA: Diagnosis present

## 2015-08-04 DIAGNOSIS — N189 Chronic kidney disease, unspecified: Secondary | ICD-10-CM | POA: Diagnosis present

## 2015-08-04 DIAGNOSIS — E440 Moderate protein-calorie malnutrition: Secondary | ICD-10-CM | POA: Insufficient documentation

## 2015-08-04 DIAGNOSIS — C189 Malignant neoplasm of colon, unspecified: Secondary | ICD-10-CM | POA: Diagnosis present

## 2015-08-04 DIAGNOSIS — K632 Fistula of intestine: Secondary | ICD-10-CM | POA: Diagnosis not present

## 2015-08-04 DIAGNOSIS — Z6820 Body mass index (BMI) 20.0-20.9, adult: Secondary | ICD-10-CM | POA: Diagnosis not present

## 2015-08-04 DIAGNOSIS — C786 Secondary malignant neoplasm of retroperitoneum and peritoneum: Secondary | ICD-10-CM | POA: Diagnosis present

## 2015-08-04 DIAGNOSIS — R109 Unspecified abdominal pain: Secondary | ICD-10-CM | POA: Diagnosis present

## 2015-08-04 DIAGNOSIS — Z923 Personal history of irradiation: Secondary | ICD-10-CM | POA: Diagnosis not present

## 2015-08-04 DIAGNOSIS — C801 Malignant (primary) neoplasm, unspecified: Secondary | ICD-10-CM

## 2015-08-04 DIAGNOSIS — K5660 Unspecified intestinal obstruction: Secondary | ICD-10-CM | POA: Diagnosis present

## 2015-08-04 DIAGNOSIS — K56609 Unspecified intestinal obstruction, unspecified as to partial versus complete obstruction: Secondary | ICD-10-CM

## 2015-08-04 DIAGNOSIS — E869 Volume depletion, unspecified: Secondary | ICD-10-CM | POA: Diagnosis present

## 2015-08-04 DIAGNOSIS — Z809 Family history of malignant neoplasm, unspecified: Secondary | ICD-10-CM | POA: Diagnosis not present

## 2015-08-04 DIAGNOSIS — Z79891 Long term (current) use of opiate analgesic: Secondary | ICD-10-CM | POA: Diagnosis not present

## 2015-08-04 DIAGNOSIS — K219 Gastro-esophageal reflux disease without esophagitis: Secondary | ICD-10-CM | POA: Diagnosis present

## 2015-08-04 DIAGNOSIS — R112 Nausea with vomiting, unspecified: Secondary | ICD-10-CM | POA: Diagnosis present

## 2015-08-04 DIAGNOSIS — K5669 Other intestinal obstruction: Secondary | ICD-10-CM | POA: Diagnosis not present

## 2015-08-04 DIAGNOSIS — Z66 Do not resuscitate: Secondary | ICD-10-CM | POA: Diagnosis present

## 2015-08-04 DIAGNOSIS — Z9221 Personal history of antineoplastic chemotherapy: Secondary | ICD-10-CM

## 2015-08-04 DIAGNOSIS — Z87891 Personal history of nicotine dependence: Secondary | ICD-10-CM | POA: Diagnosis not present

## 2015-08-04 DIAGNOSIS — Z823 Family history of stroke: Secondary | ICD-10-CM | POA: Diagnosis not present

## 2015-08-04 DIAGNOSIS — Z9049 Acquired absence of other specified parts of digestive tract: Secondary | ICD-10-CM | POA: Diagnosis not present

## 2015-08-04 DIAGNOSIS — R1084 Generalized abdominal pain: Secondary | ICD-10-CM | POA: Diagnosis not present

## 2015-08-04 DIAGNOSIS — C7989 Secondary malignant neoplasm of other specified sites: Secondary | ICD-10-CM | POA: Diagnosis not present

## 2015-08-04 DIAGNOSIS — R634 Abnormal weight loss: Secondary | ICD-10-CM | POA: Diagnosis present

## 2015-08-04 HISTORY — DX: Malignant (primary) neoplasm, unspecified: C80.1

## 2015-08-04 HISTORY — DX: Secondary malignant neoplasm of retroperitoneum and peritoneum: C78.6

## 2015-08-04 LAB — I-STAT CG4 LACTIC ACID, ED: LACTIC ACID, VENOUS: 0.95 mmol/L (ref 0.5–2.0)

## 2015-08-04 LAB — CBC
HEMATOCRIT: 43.7 % (ref 36.0–46.0)
Hemoglobin: 14 g/dL (ref 12.0–15.0)
MCH: 27.3 pg (ref 26.0–34.0)
MCHC: 32 g/dL (ref 30.0–36.0)
MCV: 85.2 fL (ref 78.0–100.0)
PLATELETS: 273 10*3/uL (ref 150–400)
RBC: 5.13 MIL/uL — ABNORMAL HIGH (ref 3.87–5.11)
RDW: 13.2 % (ref 11.5–15.5)
WBC: 6.3 10*3/uL (ref 4.0–10.5)

## 2015-08-04 LAB — COMPREHENSIVE METABOLIC PANEL
ALK PHOS: 64 U/L (ref 38–126)
ALT: 10 U/L — AB (ref 14–54)
AST: 11 U/L — ABNORMAL LOW (ref 15–41)
Albumin: 3.9 g/dL (ref 3.5–5.0)
Anion gap: 12 (ref 5–15)
BILIRUBIN TOTAL: 1.2 mg/dL (ref 0.3–1.2)
BUN: 10 mg/dL (ref 6–20)
CO2: 26 mmol/L (ref 22–32)
CREATININE: 0.83 mg/dL (ref 0.44–1.00)
Calcium: 9.8 mg/dL (ref 8.9–10.3)
Chloride: 102 mmol/L (ref 101–111)
Glucose, Bld: 91 mg/dL (ref 65–99)
Potassium: 4.1 mmol/L (ref 3.5–5.1)
Sodium: 140 mmol/L (ref 135–145)
TOTAL PROTEIN: 7.8 g/dL (ref 6.5–8.1)

## 2015-08-04 LAB — URINALYSIS, ROUTINE W REFLEX MICROSCOPIC
GLUCOSE, UA: NEGATIVE mg/dL
LEUKOCYTES UA: NEGATIVE
Nitrite: NEGATIVE
PROTEIN: 100 mg/dL — AB
Specific Gravity, Urine: >1.03 — ABNORMAL HIGH (ref 1.005–1.030)
pH: 5.5 (ref 5.0–8.0)

## 2015-08-04 LAB — URINE MICROSCOPIC-ADD ON

## 2015-08-04 LAB — LIPASE, BLOOD: Lipase: 23 U/L (ref 11–51)

## 2015-08-04 MED ORDER — SODIUM CHLORIDE 0.9 % IV SOLN: INTRAVENOUS | Status: AC

## 2015-08-04 MED ORDER — IOHEXOL 300 MG/ML  SOLN
100.0000 mL | Freq: Once | INTRAMUSCULAR | Status: AC | PRN
  Administered 2015-08-04: 100 mL via INTRAVENOUS

## 2015-08-04 MED ORDER — DIATRIZOATE MEGLUMINE & SODIUM 66-10 % PO SOLN
ORAL | Status: AC
  Administered 2015-08-04: 15 mL
  Filled 2015-08-04: qty 30

## 2015-08-04 MED ORDER — MORPHINE SULFATE (PF) 2 MG/ML IV SOLN
2.0000 mg | INTRAVENOUS | Status: DC | PRN
  Administered 2015-08-04 – 2015-08-05 (×2): 4 mg via INTRAVENOUS
  Administered 2015-08-05: 2 mg via INTRAVENOUS
  Administered 2015-08-05: 4 mg via INTRAVENOUS
  Administered 2015-08-05: 2 mg via INTRAVENOUS
  Administered 2015-08-06 (×2): 4 mg via INTRAVENOUS
  Administered 2015-08-06: 2 mg via INTRAVENOUS
  Administered 2015-08-06 (×3): 4 mg via INTRAVENOUS
  Filled 2015-08-04: qty 1
  Filled 2015-08-04 (×3): qty 2
  Filled 2015-08-04: qty 1
  Filled 2015-08-04 (×6): qty 2

## 2015-08-04 MED ORDER — HYDROMORPHONE HCL 1 MG/ML IJ SOLN
0.5000 mg | INTRAMUSCULAR | Status: AC | PRN
  Administered 2015-08-05: 0.5 mg via INTRAVENOUS
  Filled 2015-08-04: qty 1

## 2015-08-04 MED ORDER — SODIUM CHLORIDE 0.9 % IV SOLN
INTRAVENOUS | Status: DC
  Administered 2015-08-04 – 2015-08-06 (×6): via INTRAVENOUS

## 2015-08-04 MED ORDER — SODIUM CHLORIDE 0.9 % IV BOLUS (SEPSIS)
2000.0000 mL | Freq: Once | INTRAVENOUS | Status: AC
  Administered 2015-08-04: 2000 mL via INTRAVENOUS

## 2015-08-04 MED ORDER — METRONIDAZOLE IN NACL 5-0.79 MG/ML-% IV SOLN
500.0000 mg | Freq: Once | INTRAVENOUS | Status: AC
  Administered 2015-08-04: 500 mg via INTRAVENOUS
  Filled 2015-08-04: qty 100

## 2015-08-04 MED ORDER — ENOXAPARIN SODIUM 40 MG/0.4ML ~~LOC~~ SOLN: 40.0000 mg | SUBCUTANEOUS | Status: DC

## 2015-08-04 MED ORDER — ONDANSETRON HCL 4 MG/2ML IJ SOLN
4.0000 mg | INTRAMUSCULAR | Status: DC | PRN
  Administered 2015-08-04: 4 mg via INTRAVENOUS
  Filled 2015-08-04: qty 2

## 2015-08-04 MED ORDER — ONDANSETRON HCL 4 MG/2ML IJ SOLN: 4.0000 mg | Freq: Three times a day (TID) | INTRAMUSCULAR | Status: AC | PRN

## 2015-08-04 MED ORDER — MORPHINE SULFATE (PF) 4 MG/ML IV SOLN
4.0000 mg | INTRAVENOUS | Status: AC | PRN
  Administered 2015-08-04 (×2): 4 mg via INTRAVENOUS
  Filled 2015-08-04 (×2): qty 1

## 2015-08-04 MED ORDER — CIPROFLOXACIN IN D5W 400 MG/200ML IV SOLN
400.0000 mg | Freq: Once | INTRAVENOUS | Status: AC
  Administered 2015-08-04: 400 mg via INTRAVENOUS
  Filled 2015-08-04: qty 200

## 2015-08-04 NOTE — H&P (Addendum)
[facility] History and Physical  [name] [id number] DOB: [birth year] DOA: 08/04/2015  Referring physician: Dr. Thurnell Garbe PCP: [name], MD   Chief Complaint: Abd pain and nausea/ vomiting  HPI: Julie Galvan is a 65 y.o. female with hx of of HTN and colon cancer dx'd in Jan 2013.  She was treated with partial colectomy. Later she had chemoRx and radiation but per patient she didn't "do too well' with the chemoRx and had to stop it.  She had some radiation last year as well.  Now she presents with anorexia, 20 lb wt loss over last couple of months, nausea/ vomiting and severe abd pain worsening over the past week or so.  CT abdomen in the ED suggests SBO, possible enteric fistula, numerous tumor deposits/ LAN, and a focal area of possible colitis in sigmoid region.  Having some chills but no fevers or sweats.  WBC here is normal, Hb 14, alb 3.9.  Asked to admit for SBO prob related to metastatic colon cancer intra-abdominal disease.  .  Patient says she likes garden, do chores, and work.  Denies any CP, heart problems.  She has a R nephrostomy tube placed at [facility] and changed every 6 weeks, this was done after an internal stent failed.  She denies any dysuria, gross hematuria, joitn pain, rash, HA or blurred vision. She is very thirsty w dry mouth.    Where does patient live home Can patient participate in ADLs? yes  Past Medical History  Past Medical History  Diagnosis Date  . Hypertension   . Heart palpitations     intermittent" especially if forgets to take Metoprolol."  . Anxiety   . H/O seasonal allergies 11-20-13    "causes coughing"  . Chronic kidney disease     hx. of kidney stones and right ureteral stent placed due to mestatic cancer  . GERD (gastroesophageal reflux disease)   . Arthritis     fingers  . Colon cancer metastasized to multiple sites [facility])     Cecum T3N0M1  . Peritoneal carcinomatosis [facility])    Past Surgical History  Past Surgical History   Procedure Laterality Date  . Ureteral stent placement Right 11-21-13    done at [facility]  . Appendectomy  Jan 2013    1'13 dx. cancer  . Colon surgery Right 08/18/2011    colon resection for cancer and agin for obstruction  . Abdominal hysterectomy    . Breast surgery      left breast lumpectomy-benign-40 yrs ago  . Portacath placement N/A 11/22/2013    Procedure: INSERTION PORT-A-CATH LEFT SUBCLAVIAN VEIN;  Surgeon: Shann Medal, MD;  Location: [facility];  Service: General;  Laterality: N/A;   Family History  Family History  Problem Relation Age of Onset  . Cancer Mother   . Stroke Father    Social History  reports that she quit smoking about 22 months ago. Her smoking use included Cigarettes. She does not have any smokeless tobacco history on file. She reports that she does not drink alcohol or use illicit drugs. Allergies  Allergies  Allergen Reactions  . Sulfa Antibiotics Hives and Other (See Comments)    Headache    Home medications Prior to Admission medications   Medication Sig Start Date End Date Taking? Authorizing Provider  Calcium Carbonate-Vit D-Min (CALTRATE 600+D PLUS MINERALS) 600-800 MG-UNIT CHEW Chew 1 tablet by mouth 2 (two) times a week.    Yes Historical Provider, MD  Frankincense OIL Take 1 capsule  by mouth every 3 (three) days.    Yes Historical Provider, MD  metoprolol tartrate (LOPRESSOR) 25 MG tablet Take 25 mg by mouth 2 (two) times daily.   Yes Historical Provider, MD  oxyCODONE (OXY IR/ROXICODONE) 5 MG immediate release tablet Take 5 mg by mouth every 6 (six) hours as needed for moderate pain or severe pain.   Yes Historical Provider, MD  ranitidine (ZANTAC) 150 MG tablet Take 150 mg by mouth 2 (two) times daily.   Yes Historical Provider, MD   Liver Function Tests  Recent Labs Lab 08/04/15 1455  AST 11*  ALT 10*  ALKPHOS 64  BILITOT 1.2  PROT 7.8  ALBUMIN 3.9    Recent Labs Lab 08/04/15 1455  LIPASE 23   CBC  Recent Labs Lab  08/04/15 1455  WBC 6.3  HGB 14.0  HCT 43.7  MCV 85.2  PLT 123456   Basic Metabolic Panel  Recent Labs Lab 08/04/15 1455  NA 140  K 4.1  CL 102  CO2 26  GLUCOSE 91  BUN 10  CREATININE 0.83  CALCIUM 9.8     Filed Vitals:   08/04/15 1800 08/04/15 1830 08/04/15 1922 08/04/15 1930  BP: 122/63 118/63 126/71 117/64  Pulse:   69 66  Temp:      TempSrc:      Resp:   16   Height:      Weight:      SpO2:   100% 99%   Exam: Alert no distress Sclera anicteric, throat dry w/o lesions No jvd Chest clear bilat Cor reg with no MRG Abd firm, nondistended, diffusely tender, +bs GU defer MS no joint effusions Ext no LE edema  No wounds/ ulcers Neuro is alert, nf ox 3   Home medications > CaCO3, metoprolol 25 bid, oxycodone 5 mg qid prn, zantac  Na 140 BUN 10  Creat 0.83  Ca 9.8  Alb 3.9  Hb 14.0  WBC 6k  UA 0-5 wbc, 6-30 rbc  CT abd /pelvis >  IMPRESSION:  1. Distal small bowel obstruction at the level of the right pelvic sidewall tumor implant, which is decreased in size since 07/24/2014. 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 Wall thickening in the mid to distal sigmoid colon suggests a nonspecific sigmoid colitis.  2. New retroperitoneal and right common iliac lymphadenopathy, probably metastatic .  3. Interval growth of peritoneal tumor implants in the anterior right lower quadrant and abutting the left rectus muscle sheath. Stable left pelvic side wall peritoneal tumor implant.  4. No hydronephrosis. Stable well-positioned right percutaneous nephrostomy tube. Stable asymmetric atrophy of the right kidney.  Electronically Signed By: Ilona Sorrel M.D. On: 08/04/2015 17:58    Assessment: 1 Nausea/ vomiting / probable small bowel obstruction by CT - suspect related to intra-abd spread of known colon cancer. No fever/ ^WBC, hold off on abx for now.  2 Wt loss 3 Vol depletion - not severe 4 Metastatic colon  cancer - mostly intra-abdominal disease it appears. F/B [facility], not on any Rx right now. Initial dx was in Jan 2013.  5 HTN - bp's soft, hold MTP  Plan - admit, IVF"s, NPO. Gen surg called by ED MD, to see tomorrow. IV MSO4 for pain control.    DVT Prophylaxis lovenox  Code Status: full, not discussed  Family Communication: sister at bedside  Disposition Plan: unclear          Shelby D [facility] Pager [phone]  Cell [phone]  If 7PM-7AM, please contact night-coverage [email or web address] Password [facility] 08/04/2015, 7:59 PM

## 2015-08-04 NOTE — ED Notes (Signed)
Pt comes in with abdominal pain starting last Thursday with pt starting to have emesis on Saturday. Pt has been unable to keep food down starting yesterday. Pt does have colon cancer and has finished chemo and radiation. Last BM was yesterday that was solid

## 2015-08-04 NOTE — ED Provider Notes (Signed)
CSN: [id number]     Arrival date & time 08/04/15  1212 History   First MD Initiated Contact with Patient 08/04/15 1356     Chief Complaint  Patient presents with  . Abdominal Pain      HPI  Pt was seen at 1420.  Per pt, c/o gradual onset and persistence of constant generalized abd "pain" for the past several weeks, worse for the past 4 to 5 days.  Has been associated with multiple intermittent episodes of N/V. States she "thought I was constipated" last week, so she took miralax with resultant BM. Pt has hx of colon CA with LD chemo/XRT "about 2 years ago." Denies diarrhea, no fevers, no back pain, no rash, no CP/SOB, no black or blood in stools or emesis.      Past Medical History  Diagnosis Date  . Hypertension   . Heart palpitations     intermittent" especially if forgets to take Metoprolol."  . Anxiety   . H/O seasonal allergies 11-20-13    "causes coughing"  . Chronic kidney disease     hx. of kidney stones and right ureteral stent placed due to mestatic cancer  . GERD (gastroesophageal reflux disease)   . Arthritis     fingers  . Colon cancer metastasized to multiple sites [facility])     Cecum T3N0M1  . Peritoneal carcinomatosis [facility])    Past Surgical History  Procedure Laterality Date  . Ureteral stent placement Right 11-21-13    done at [facility]  . Appendectomy  Jan 2013    1'13 dx. cancer  . Colon surgery Right 08/18/2011    colon resection for cancer and agin for obstruction  . Abdominal hysterectomy    . Breast surgery      left breast lumpectomy-benign-40 yrs ago  . Portacath placement N/A 11/22/2013    Procedure: INSERTION PORT-A-CATH LEFT SUBCLAVIAN VEIN;  Surgeon: Shann Medal, MD;  Location: [facility];  Service: General;  Laterality: N/A;   Family History  Problem Relation Age of Onset  . Cancer Mother   . Stroke Father    Social History  Substance Use Topics  . Smoking status: Former Smoker    Types: Cigarettes    Quit date: 09/21/2013  . Smokeless  tobacco: None  . Alcohol Use: No    Review of Systems ROS: Statement: All systems negative except as marked or noted in the HPI; Constitutional: Negative for fever and chills. ; ; Eyes: Negative for eye pain, redness and discharge. ; ; ENMT: Negative for ear pain, hoarseness, nasal congestion, sinus pressure and sore throat. ; ; Cardiovascular: Negative for chest pain, palpitations, diaphoresis, dyspnea and peripheral edema. ; ; Respiratory: Negative for cough, wheezing and stridor. ; ; Gastrointestinal: +abd pain, N/V. Negative for diarrhea, blood in stool, hematemesis, jaundice and rectal bleeding. . ; ; Genitourinary: Negative for dysuria, flank pain and hematuria. ; ; Musculoskeletal: Negative for back pain and neck pain. Negative for swelling and trauma.; ; Skin: Negative for pruritus, rash, abrasions, blisters, bruising and skin lesion.; ; Neuro: Negative for headache, lightheadedness and neck stiffness. Negative for weakness, altered level of consciousness , altered mental status, extremity weakness, paresthesias, involuntary movement, seizure and syncope.     Allergies  Sulfa antibiotics  Home Medications   Prior to Admission medications   Medication Sig Start Date End Date Taking? Authorizing Provider  Calcium Carbonate-Vit D-Min (CALTRATE 600+D PLUS MINERALS) 600-800 MG-UNIT CHEW Chew 1 tablet by mouth 2 (two) times a week.  Yes Historical Provider, MD  Frankincense OIL Take 1 capsule by mouth every 3 (three) days.    Yes Historical Provider, MD  metoprolol tartrate (LOPRESSOR) 25 MG tablet Take 25 mg by mouth 2 (two) times daily.   Yes Historical Provider, MD  oxyCODONE (OXY IR/ROXICODONE) 5 MG immediate release tablet Take 5 mg by mouth every 6 (six) hours as needed for moderate pain or severe pain.   Yes Historical Provider, MD  ranitidine (ZANTAC) 150 MG tablet Take 150 mg by mouth 2 (two) times daily.   Yes Historical Provider, MD   BP 132/76 mmHg  Pulse 85  Temp(Src) 98.4 F  (36.9 C) (Oral)  Resp 16  Ht 5\' 2"  (1.575 m)  Wt 111 lb (50.349 kg)  BMI 20.30 kg/m2  SpO2 100% Physical Exam  1425: Physical examination:  Nursing notes reviewed; Vital signs and O2 SAT reviewed;  Constitutional: Well developed, Well nourished, Well hydrated, In no acute distress; Head:  Normocephalic, atraumatic; Eyes: EOMI, PERRL, No scleral icterus; ENMT: Mouth and pharynx normal, Mucous membranes moist; Neck: Supple, Full range of motion, No lymphadenopathy; Cardiovascular: Regular rate and rhythm, No gallop; Respiratory: Breath sounds clear & equal bilaterally, No wheezes.  Speaking full sentences with ease, Normal respiratory effort/excursion; Chest: Nontender, Movement normal; Abdomen: Soft, +diffuse tenderness to palp. Nondistended, Normal bowel sounds; Genitourinary: No CVA tenderness. +right nephrostomy tube in place with clear/yellow urine.; Extremities: Pulses normal, No tenderness, No edema, No calf edema or asymmetry.; Neuro: AA&Ox3, Major CN grossly intact.  Speech clear. No gross focal motor or sensory deficits in extremities.; Skin: Color normal, Warm, Dry.   ED Course  Procedures (including critical care time) Labs Review  Imaging Review  I have personally reviewed and evaluated these images and lab results as part of my medical decision-making.   EKG Interpretation None      MDM  MDM Reviewed: previous chart, nursing note and vitals Reviewed previous: labs Interpretation: labs and CT scan     Results for orders placed or performed during the hospital encounter of 08/04/15  Lipase, blood  Result Value Ref Range   Lipase 23 11 - 51 U/L  Comprehensive metabolic panel  Result Value Ref Range   Sodium 140 135 - 145 mmol/L   Potassium 4.1 3.5 - 5.1 mmol/L   Chloride 102 101 - 111 mmol/L   CO2 26 22 - 32 mmol/L   Glucose, Bld 91 65 - 99 mg/dL   BUN 10 6 - 20 mg/dL   Creatinine, Ser 0.83 0.44 - 1.00 mg/dL   Calcium 9.8 8.9 - 10.3 mg/dL   Total Protein 7.8  6.5 - 8.1 g/dL   Albumin 3.9 3.5 - 5.0 g/dL   AST 11 (L) 15 - 41 U/L   ALT 10 (L) 14 - 54 U/L   Alkaline Phosphatase 64 38 - 126 U/L   Total Bilirubin 1.2 0.3 - 1.2 mg/dL   GFR calc non Af Amer >60 >60 mL/min   GFR calc Af Amer >60 >60 mL/min   Anion gap 12 5 - 15  CBC  Result Value Ref Range   WBC 6.3 4.0 - 10.5 K/uL   RBC 5.13 (H) 3.87 - 5.11 MIL/uL   Hemoglobin 14.0 12.0 - 15.0 g/dL   HCT 43.7 36.0 - 46.0 %   MCV 85.2 78.0 - 100.0 fL   MCH 27.3 26.0 - 34.0 pg   MCHC 32.0 30.0 - 36.0 g/dL   RDW 13.2 11.5 - 15.5 %   Platelets  273 150 - 400 K/uL  Urinalysis, Routine w reflex microscopic (not at [facility])  Result Value Ref Range   Color, Urine AMBER (A) YELLOW   APPearance HAZY (A) CLEAR   Specific Gravity, Urine >1.030 (H) 1.005 - 1.030   pH 5.5 5.0 - 8.0   Glucose, UA NEGATIVE NEGATIVE mg/dL   Hgb urine dipstick SMALL (A) NEGATIVE   Bilirubin Urine SMALL (A) NEGATIVE   Ketones, ur TRACE (A) NEGATIVE mg/dL   Protein, ur 100 (A) NEGATIVE mg/dL   Nitrite NEGATIVE NEGATIVE   Leukocytes, UA NEGATIVE NEGATIVE  Urine microscopic-add on  Result Value Ref Range   Squamous Epithelial / LPF 0-5 (A) NONE SEEN   WBC, UA 0-5 0 - 5 WBC/hpf   RBC / HPF 6-30 0 - 5 RBC/hpf   Bacteria, UA FEW (A) NONE SEEN   Urine-Other MUCOUS PRESENT   I-Stat CG4 Lactic Acid, ED  Result Value Ref Range   Lactic Acid, Venous 0.95 0.5 - 2.0 mmol/L   Ct Abdomen Pelvis W Contrast 08/04/2015  CLINICAL DATA:  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 EXAM: CT ABDOMEN AND PELVIS WITH CONTRAST TECHNIQUE: Multidetector CT imaging of the abdomen and pelvis was performed using the standard protocol following bolus administration of intravenous contrast. CONTRAST:  127mL OMNIPAQUE IOHEXOL 300 MG/ML  SOLN COMPARISON:  07/24/2014 CT abdomen/pelvis. FINDINGS: Lower chest: No significant pulmonary nodules or acute consolidative  airspace disease. Hepatobiliary: Stable 3.4 cm segment 4A, 1.2 cm segment 4B and 0.9 cm segment 6 liver hemangiomas, all unchanged since 07/05/2011. No new liver masses. Stable focal adenomyomatosis in the gallbladder fundus. Stable 0.5 cm dense focus in the dependent gallbladder wall, either a polyp or adherent stone. Otherwise no gallbladder wall thickening or pericholecystic fluid. No biliary ductal dilatation. Pancreas: Normal, with no mass or duct dilation. Spleen: Normal size. No mass. Adrenals/Urinary Tract: Normal adrenals. No hydronephrosis. Right percutaneous nephrostomy tube is stable in position with the distal pigtail within the right renal pelvis. Expected small amount of gas in the right renal collecting system from instrumentation. Stable asymmetric atrophy of the right kidney. Simple 1.5 cm renal cyst in the posterior upper left kidney. There is dilatation of the lumbar segment of the right ureter. Normal bladder. Stomach/Bowel: Grossly normal stomach. There is moderate dilatation of the proximal and mid small bowel to the level of the right pelvic small bowel, with a focal small bowel caliber transition in the right pelvis (series 2/ image 45 and series 3/ image 42), which is adjacent to the 3.0 x 1.8 cm right pelvic sidewall heterogeneous mass (series 2/ image 51), which is decreased in size from 6.9 x 5.9 cm on 07/24/2014. 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 Stable postsurgical changes in the right abdomen status post ileocecal resection with stable appearance of the neo ileocolic anastomosis. Circumferential wall thickening in the mid to distal sigmoid colon is nonspecific and most suggestive of colitis. Vascular/Lymphatic: Atherosclerotic nonaneurysmal abdominal aorta. Patent portal, splenic, hepatic and renal veins. There is new mild-to-moderate aortocaval and right common iliac lymphadenopathy,  for example a 1.5 cm aortocaval node (series 2/ image 25) and a 1.3 cm right common iliac node (series 2/ image 37). Reproductive: Status post hysterectomy, with no new abnormality at the vaginal cuff. Other: No pneumoperitoneum. Left deep pelvic heterogeneous 3.0 x 2.8 cm mass (series  2/ image 57), previously 3.9 x 2.5 cm, not definitely changed. Left anterior peritoneal 1.6 cm tumor implant adjacent to the left rectus muscle sheath (series 2/image 45), increased from 0.7 cm. Anterior right lower quadrant 2.3 x 2.1 cm peritoneal tumor implant (series 2/image 42), previously 1.0 x 1.0 cm, increased. Musculoskeletal: No aggressive appearing focal osseous lesions. Mild degenerative changes in the visualized thoracolumbar spine. IMPRESSION: 1. Distal small bowel obstruction at the level of the right pelvic sidewall tumor implant, which is decreased in size since 07/24/2014. 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 Wall thickening in the mid to distal sigmoid colon suggests a nonspecific sigmoid colitis. 2. New retroperitoneal and right common iliac lymphadenopathy, probably metastatic . 3. Interval growth of peritoneal tumor implants in the anterior right lower quadrant and abutting the left rectus muscle sheath. Stable left pelvic side wall peritoneal tumor implant. 4. No hydronephrosis. Stable well-positioned right percutaneous nephrostomy tube. Stable asymmetric atrophy of the right kidney. Electronically Signed   By: Ilona Sorrel M.D.   On: 08/04/2015 17:58    1840:  Will start IV cipro/flagyl for colitis. Dx and testing d/w pt and family.  Questions answered.  Verb understanding, agreeable to admit.  T/C to General Surgery Dr. Arnoldo Morale, case discussed, including:  HPI, pertinent PM/SHx, VS/PE, dx testing, ED course and treatment:  Agreeable to consult, requests to admit to medicine service, pt will also need Heme/Onc MD consult. T/C to [facility] Dr.  Jonnie Finner, case discussed, including:  HPI, pertinent PM/SHx, VS/PE, dx testing, ED course and treatment:  Agreeable to admit, requests to write temporary orders, obtain medical bed to team APAdmits.   Francine Graven, DO 08/08/15 2259

## 2015-08-05 DIAGNOSIS — R1110 Vomiting, unspecified: Secondary | ICD-10-CM

## 2015-08-05 DIAGNOSIS — K5669 Other intestinal obstruction: Secondary | ICD-10-CM

## 2015-08-05 DIAGNOSIS — C801 Malignant (primary) neoplasm, unspecified: Secondary | ICD-10-CM

## 2015-08-05 DIAGNOSIS — K632 Fistula of intestine: Secondary | ICD-10-CM | POA: Insufficient documentation

## 2015-08-05 DIAGNOSIS — E869 Volume depletion, unspecified: Secondary | ICD-10-CM

## 2015-08-05 DIAGNOSIS — Z809 Family history of malignant neoplasm, unspecified: Secondary | ICD-10-CM

## 2015-08-05 DIAGNOSIS — R1084 Generalized abdominal pain: Secondary | ICD-10-CM

## 2015-08-05 DIAGNOSIS — R109 Unspecified abdominal pain: Secondary | ICD-10-CM

## 2015-08-05 DIAGNOSIS — R599 Enlarged lymph nodes, unspecified: Secondary | ICD-10-CM

## 2015-08-05 DIAGNOSIS — K5660 Unspecified intestinal obstruction: Secondary | ICD-10-CM

## 2015-08-05 DIAGNOSIS — C786 Secondary malignant neoplasm of retroperitoneum and peritoneum: Principal | ICD-10-CM

## 2015-08-05 DIAGNOSIS — K529 Noninfective gastroenteritis and colitis, unspecified: Secondary | ICD-10-CM | POA: Insufficient documentation

## 2015-08-05 NOTE — Consult Note (Signed)
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  HISTORY OF PRESENT ILLNESS:   Julie Galvan is a 65 yo white American female with a past medical history significant for Stage IV well-differentiated adenocarcinoma of colon, KRAS-positive, (with documentation of appendiceal carcinoma from outside  Medical oncology dictations) initially presenting with Stage II disease managed surgically and then found to have with bulky pelvic disease with a history of severe right hydronephrosis secondary to a right pelvic mass S/P FOLFOX + Avastin by Dr. Jacquiline Doe last treated in December 2015 and discontinued due to intolerance.  Since then, she has undergone palliative radiation by Dr. Tammi Klippel.  EXACT DETAILS ARE UNKNOWN.  I personally reviewed and went over laboratory results with the patient.  The results are noted within this dictation.  Labs are noted and unimpressive.  I personally reviewed and went over radiographic studies with the patient.  The results are noted within this dictation.  CT abd/pelvis report is reviewed and images are noted.  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  Chart  reviewed.  According to the patient, she underwent an appendectomy in 2013 and at that time, she reports that her surgeon was suspicious for malignancy given inspection of the removed appendix.  She notes that pathology was positive for disease which lead to a colonoscopy and further colon resection for adenocarcinoma.  She notes that 5 lymph nodes were removed at time of this surgery and all were negative for metastatic disease.  No further therapy was performed at that time according to the patient.  She notes that in 2015, her primary care provider was suspicious about a finding on physical exam which lead to an [place] of pelvis.  This was subsequently followed by a CT abd/pelvis and then a PET scan.  PET scan in May 2015 demonstrated metastatic disease with multiple soft tissue lesions that were hypermetabolic through the lower mesentery and bilateral pelvis. She notes that she was subsequently treated with chemotherapy.  She was treated with systemic chemotherapy consisting of FOLFOX + Avastin.  She reports that she was nauseated and suffered with severe fatigue and tiredness.  As a result, she stopped therapy and was last treated in December of 2015.  She was then followed by Dr. Jacquiline Doe for a short time and then failed to continue to follow with him with last appointment documented in [place] was on 10/04/2014 with a requested 3 months follow-up appointment according to his dictation, but no more recent documentation.  She has been treated with palliative radiation by Dr. Tammi Klippel, most recent treatment to residual pelvic lymph nodes in 10 fractions.  Patient reports abdominal pain that is not present at this time during our 1.5 hour discussion  She admits to weight  loss of about 20 lbs over the past 2 months.  She presented to the ED with abdominal pain and vomiting.  She notes that she has been consuming a significant amount of fluids at home when she noticed signs and symptoms of bowel obstruction.   She notes that she had an obstruction in the past and remembers how it was managed conservatively at an outside facility.    PAST MEDICAL HISTORY:   Past Medical History  Diagnosis Date  . Hypertension   . Heart palpitations     intermittent" especially if forgets to take Metoprolol."  . Anxiety   . H/O seasonal allergies 11-20-13    "causes coughing"  . Chronic kidney disease     hx. of kidney stones and right ureteral stent placed due to mestatic cancer  . GERD (gastroesophageal reflux disease)   . Arthritis     fingers  . Colon cancer metastasized to multiple sites [facility])     Cecum T3N0M1  . Peritoneal carcinomatosis (HCC)     ALLERGIES: Allergies  Allergen Reactions  . Sulfa Antibiotics Hives and Other (See Comments)    Headache       MEDICATIONS: I have reviewed the patient's current medications.     PAST SURGICAL HISTORY Past Surgical History  Procedure Laterality Date  . Ureteral stent placement Right 11-21-13    done at [facility]  . Appendectomy  Jan 2013    1'13 dx. cancer  . Colon surgery Right 08/18/2011    colon resection for cancer and agin for obstruction  . Abdominal hysterectomy    . Breast surgery      left breast lumpectomy-benign-40 yrs ago  . Portacath placement N/A 11/22/2013    Procedure: INSERTION PORT-A-CATH LEFT SUBCLAVIAN VEIN;  Surgeon: Shann Medal, MD;  Location: [facility] ORS;  Service: General;  Laterality: N/A;    FAMILY HISTORY: Family History  Problem Relation Age of Onset  . Cancer Mother   . Stroke Father     SOCIAL HISTORY:  reports that she quit smoking about 22 months ago. Her smoking use included Cigarettes. She does not have any smokeless tobacco history on file. She reports that she does not drink alcohol or use illicit drugs.  PERFORMANCE STATUS: The patient's performance status is 1 - Symptomatic but completely ambulatory  PHYSICAL EXAM: Most Recent Vital Signs: Blood pressure 130/57, pulse 68, temperature 98 F (36.7  C), temperature source Oral, resp. rate 18, height 5' 2"  (1.575 m), weight 112 lb 12.8 oz (51.166 kg), SpO2 94 %. General appearance: alert, cooperative, appears older than stated age, no distress and accompanied by sister brenda and later her husband Head: Normocephalic, without obvious abnormality, atraumatic Eyes: negative findings: lids and lashes normal and conjunctivae and sclerae normal Neck: no adenopathy and supple, symmetrical, trachea midline Lungs: clear to auscultation bilaterally Heart: regular rate and rhythm, S1, S2 normal, no murmur, click, rub or gallop Abdomen: normal findings: no masses palpable, symmetric and nontender but patient protective Extremities: extremities normal, atraumatic, no cyanosis or edema Skin: Skin color, texture, turgor normal. No rashes or lesions Neurologic: Grossly normal  LABORATORY DATA:  Results for orders placed or performed during the hospital encounter of 08/04/15 (from the past 48 hour(s))  Urinalysis, Routine w reflex microscopic (not at [facility])     Status: Abnormal   Collection Time: 08/04/15  2:20 PM  Result Value Ref Range   Color, Urine AMBER (A) YELLOW    Comment: BIOCHEMICALS MAY BE AFFECTED BY COLOR  APPearance HAZY (A) CLEAR   Specific Gravity, Urine >1.030 (H) 1.005 - 1.030   pH 5.5 5.0 - 8.0   Glucose, UA NEGATIVE NEGATIVE mg/dL   Hgb urine dipstick SMALL (A) NEGATIVE   Bilirubin Urine SMALL (A) NEGATIVE   Ketones, ur TRACE (A) NEGATIVE mg/dL   Protein, ur 100 (A) NEGATIVE mg/dL   Nitrite NEGATIVE NEGATIVE   Leukocytes, UA NEGATIVE NEGATIVE  Urine microscopic-add on     Status: Abnormal   Collection Time: 08/04/15  2:20 PM  Result Value Ref Range   Squamous Epithelial / LPF 0-5 (A) NONE SEEN   WBC, UA 0-5 0 - 5 WBC/hpf   RBC / HPF 6-30 0 - 5 RBC/hpf   Bacteria, UA FEW (A) NONE SEEN   Urine-Other MUCOUS PRESENT   Lipase, blood     Status: None   Collection Time: 08/04/15  2:55 PM  Result Value Ref Range   Lipase 23  11 - 51 U/L  Comprehensive metabolic panel     Status: Abnormal   Collection Time: 08/04/15  2:55 PM  Result Value Ref Range   Sodium 140 135 - 145 mmol/L   Potassium 4.1 3.5 - 5.1 mmol/L   Chloride 102 101 - 111 mmol/L   CO2 26 22 - 32 mmol/L   Glucose, Bld 91 65 - 99 mg/dL   BUN 10 6 - 20 mg/dL   Creatinine, Ser 0.83 0.44 - 1.00 mg/dL   Calcium 9.8 8.9 - 10.3 mg/dL   Total Protein 7.8 6.5 - 8.1 g/dL   Albumin 3.9 3.5 - 5.0 g/dL   AST 11 (L) 15 - 41 U/L   ALT 10 (L) 14 - 54 U/L   Alkaline Phosphatase 64 38 - 126 U/L   Total Bilirubin 1.2 0.3 - 1.2 mg/dL   GFR calc non Af Amer >60 >60 mL/min   GFR calc Af Amer >60 >60 mL/min    Comment: (NOTE) The eGFR has been calculated using the CKD EPI equation. This calculation has not been validated in all clinical situations. eGFR's persistently <60 mL/min signify possible Chronic Kidney Disease.    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 EXAM: CT ABDOMEN AND PELVIS WITH CONTRAST TECHNIQUE: Multidetector CT imaging of the abdomen and pelvis was performed using the standard protocol following bolus administration of intravenous contrast. CONTRAST:  132m OMNIPAQUE IOHEXOL 300 MG/ML  SOLN COMPARISON:  07/24/2014 CT abdomen/pelvis. FINDINGS: Lower  chest: No significant pulmonary nodules or acute consolidative airspace disease. Hepatobiliary: Stable 3.4 cm segment 4A, 1.2 cm segment 4B  and 0.9 cm segment 6 liver hemangiomas, all unchanged since 07/05/2011. No new liver masses. Stable focal adenomyomatosis in the gallbladder fundus. Stable 0.5 cm dense focus in the dependent gallbladder wall, either a polyp or adherent stone. Otherwise no gallbladder wall thickening or pericholecystic fluid. No biliary ductal dilatation. Pancreas: Normal, with no mass or duct dilation. Spleen: Normal size. No mass. Adrenals/Urinary Tract: Normal adrenals. No hydronephrosis. Right percutaneous nephrostomy tube is stable in position with the distal pigtail within the right renal pelvis. Expected small amount of gas in the right renal collecting system from instrumentation. Stable asymmetric atrophy of the right kidney. Simple 1.5 cm renal cyst in the posterior upper left kidney. There is dilatation of the lumbar segment of the right ureter. Normal bladder. Stomach/Bowel: Grossly normal stomach. There is moderate dilatation of the proximal and mid small bowel to the level of the right pelvic small bowel, with a focal small bowel caliber transition in the right pelvis (series 2/ image 45 and series 3/ image 42), which is adjacent to the 3.0 x 1.8 cm right pelvic sidewall heterogeneous mass (series 2/ image 51), which is decreased in size from 6.9 x 5.9 cm on 07/24/2014. 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 Stable postsurgical changes in the right abdomen status post ileocecal resection with stable appearance of the neo ileocolic anastomosis. Circumferential wall thickening in the mid to distal sigmoid colon is nonspecific and most suggestive of colitis. Vascular/Lymphatic: Atherosclerotic nonaneurysmal abdominal aorta. Patent portal, splenic, hepatic and renal veins. There is new  mild-to-moderate aortocaval and right common iliac lymphadenopathy, for example a 1.5 cm aortocaval node (series 2/ image 25) and a 1.3 cm right common iliac node (series 2/ image 37). Reproductive: Status post hysterectomy, with no new abnormality at the vaginal cuff. Other: No pneumoperitoneum. Left deep pelvic heterogeneous 3.0 x 2.8 cm mass (series 2/ image 57), previously 3.9 x 2.5 cm, not definitely changed. Left anterior peritoneal 1.6 cm tumor implant adjacent to the left rectus muscle sheath (series 2/image 45), increased from 0.7 cm. Anterior right lower quadrant 2.3 x 2.1 cm peritoneal tumor implant (series 2/image 42), previously 1.0 x 1.0 cm, increased. Musculoskeletal: No aggressive appearing focal osseous lesions. Mild degenerative changes in the visualized thoracolumbar spine. IMPRESSION: 1. Distal small bowel obstruction at the level of the right pelvic sidewall tumor implant, which is decreased in size since 07/24/2014. 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 Wall thickening in the mid to distal sigmoid colon suggests a nonspecific sigmoid colitis. 2. New retroperitoneal and right common iliac lymphadenopathy, probably metastatic . 3. Interval growth of peritoneal tumor implants in the anterior right lower quadrant and abutting the left rectus muscle sheath. Stable left pelvic side wall peritoneal tumor implant. 4. No hydronephrosis. Stable well-positioned right percutaneous nephrostomy tube. Stable asymmetric atrophy of the right kidney. Electronically Signed   By: Ilona Sorrel M.D.   On: 08/04/2015 17:58       PATHOLOGY:  Nothing new.  ASSESSMENT/PLAN:  Julie Galvan is a nice 65 yo old white American who was treated at an outside cancer center by Dr. Jacquiline Doe for Stage IV, well-differentiated  adenocarcinoma of colon, KRAS-positive, (with documentation of appendiceal carcinoma from outside  Medical oncology dictations) initially  presenting with Stage II disease managed surgically and then found to have with bulky pelvic disease with a history of severe  right hydronephrosis secondary to a right pelvic mass S/P FOLFOX + Avastin by Dr. Jacquiline Doe last treated in December 2015 and discontinued due to intolerance.  Since then, she has undergone palliative radiation by Dr. Tammi Klippel.  EXACT DETAILS ARE UNKNOWN.  Documented poor patient compliance in her medical records, in addition to being a poor historian.  From what I can gather, the patient refused oncology follow-up in 2013 when she underwent appendectomy followed by open right proximal colectomy (T3N0) in [place], [place].  She was found to have metastatic disease in April 2015 with CT imaging demonstrating severe right hydronephrosis secondary to a right pelvic tumor implant resulting in medical oncology involvement at which time she underwent FOLFOX + Avastin with apparently a good response.  The patient reports that she did not tolerate chemotherapy well and she suffered from nausea and severe fatigue.  She was last treated with FOLFOX + Avastin in December 2015 and since that time, the patient has undergone 2 episodes of palliative radiation therapy by Dr. Tyler Pita in Jan/Feb 2016 and again from 5/25- 12/09/2014.  Her right hydronephrosis was treated with a percutaneous urostomy after repeated failed attempts of stent placement.  Her images are reviewed in detail.  Her medical oncology chart is reviewed from outside records.  I discussed her staging with the patient.  She is advised that her diagnosis is incurable and thus terminal.  However, it is treatable.  She is currently a FULL CODE and we spent time discussing code status.  After our discussion, she is favoring a change in her code status but has not yet confirmed this at this time.  She would be best served as a dnr given her diagnosis and prognosis.  Further code status discussions will need to take place moving  forward.  After detailed review, we have decided to discuss her case with Dr. Eugenia Pancoast, Surgical Oncologist, at [facility].  She may be a candidate for debulking surgery and possibly HIPEC treatment at [facility].  Given her small bowel obstruction and new coloenteric fistula, HOWEVER, CT findings are suggestive of retroperitoneal lymphadenopathy suspicious for metastatic disease which may preclude some of the aforementioned interventions.  Case discussed with Dr. Eugenia Pancoast at [facility].  He is happy to see the patient in the outpatient setting to review her case and see if he can help in the patient's treatment from a surgical oncology standpoint.  We will transmit appropriate information to Dr. Eugenia Pancoast.  CEA ordered.  CT of chest ordered to complete staging for tomorrow.  Depending on future oncology plans, KRAS testing would be helpful from a systemic chemotherapy standpoint.  From an obstruction standpoint, she is asymptomatic at this current time.  There is mention in her chart noting the possible need for TPN, but her serum protein and albumin are WNL.  Recommend advance diet as tolerated and will defer to hospitalist and Dr. Arnoldo Morale.  Suspect discharge in the next 48 to 72 hours depending on advancement of diet.  Oncology history is developed from information I have available to me at this time:    Colon cancer metastasized to multiple sites [facility])   08/18/2011 Initial Diagnosis Status post appendectomy.  Cancer found within specimen.  Underwent followup open right proximal colectomy for per 2013.  T3 N0.  0/16 lymph nodes positive.  Declined followup.   10/18/2013 Imaging CT abd/pelvis- severe right-sided hydronephrosis    Survivorship Found to have hydronephrosis and masses in the pelvis very suspicious for metastatic disease.  Recommendation made for chemotherapy.  10/31/2013 PET scan Multiple hypermetabolic soft tissue lesions scattered through the lower mesentery and bilateral pelvis, consistent with  metastatic disease.   11/26/2013 - 06/18/2014 Chemotherapy FOLFOX + Avastin by Dr. Jacquiline Doe at [place]   07/01/2014 - 07/15/2014 Radiation Therapy Dr. Tammi Klippel   11/13/2014 - 12/09/2014 Radiation Therapy 28 Gy in 10 fractions to residula pelvic lymph nodes by Dr. Tammi Klippel   08/04/2015 [facility] Admission Abdominal pain   08/04/2015 Imaging CT abd/pelvis- Distal small bowel obstruction at the level of the right pelvic sidewall tumor implant, which is decreased in size since 07/24/2014. 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      All questions were answered. The patient knows to call the clinic with any problems, questions or concerns. We can certainly see the patient much sooner if necessary.  Patient and plan discussed with Dr. Ancil Linsey and she is in agreement with the aforementioned.   KEFALAS,THOMAS  08/05/2015 6:29 PM  Patient seen and examined.  As above.  CT of the chest ordered and needed. If pulmonary metastases present only options are chemotherapy.  Retroperitoneal adenopathy would also preclude HIPEC.  In regards to chemotherapy however,  the "possible" colo-enteric fistula is concerning. I discussed with her that surgical consultation will be necessary.  I have discussed with Dr. Eugenia Pancoast at [facility]. Plan will be to see her post d/c if able.  If ok with hospitalist service would try to advance her diet. Will continue to follow through d/c.  Donald Pore MD

## 2015-08-05 NOTE — Progress Notes (Signed)
Initial Nutrition Assessment  DOCUMENTATION CODES:   Non-severe (moderate) malnutrition in context of chronic illness  INTERVENTION:  -Upon diet advancement per MD, provide Boost Breeze TID, 250 kcal, 9 grams of protein per bottle  NUTRITION DIAGNOSIS:   Inadequate oral intake related to poor appetite, chronic illness as evidenced by NPO status, per patient/family report, energy intake < 75% for > or equal to 3 months, percent weight loss.  GOAL:   Patient will meet greater than or equal to 90% of their needs  MONITOR:   Diet advancement  REASON FOR ASSESSMENT:   Malnutrition Screening Tool  ASSESSMENT:   Pt with hx of of HTN and colon cancer. She was treated with partial colectomy. Later she had chemoRx and radiation but per patient had to stop it.   Pt seen for MST. Pt states her appetite has been poor and continually declining over 6 months. Unable to provide a usual diet pattern. Pt states she tried carnation breakfast as a nutritional supplement at home, but unable to tolerate it because it was milk based. Upon diet advancement will provide Boost Breeze TID to help meet nutritional needs. Pt was agreeable to [facility] since it is a juice and not a milk product.  Pt reports a weight loss of 20 lb (16%) over couple months. Weight history per chart does not provided enough history to support. Pt does stated her usual body weight is 140-145 lb, she is currently at 112.6. This is a 20% decline in weight.  Unable to conduct nutrition focused exam, will at follow up.   Medications reviewed. Labs reviewed.  Diet Order:  Diet NPO time specified  Skin:  Reviewed, no issues  Last BM:  08/04/2015  Height:   Ht Readings from Last 1 Encounters:  08/04/15 5\' 2"  (1.575 m)    Weight:   Wt Readings from Last 1 Encounters:  08/05/15 112 lb 12.8 oz (51.166 kg)    Ideal Body Weight:  50 kg  BMI:  Body mass index is 20.63 kg/(m^2).  Estimated Nutritional Needs:   Kcal:   1800-2000  Protein:  75-85  Fluid:  >/= 2 L  EDUCATION NEEDS:   No education needs identified at this time  Raford Pitcher, Dietetic Intern Pager: [phone]

## 2015-08-05 NOTE — Progress Notes (Signed)
[facility] PROGRESS NOTE  Julie Galvan [id number] DOB: 04/09/1951 DOA: 08/04/2015 PCP: [name], MD  Assessment/Plan: Nausea vomiting/abdominal pain -Likely due to small bowel obstruction seen on CT related to intra-abdominal spread of colon cancer. -Seen in consultation by Dr. Arnoldo Morale, if were to surgery with her to be referred to a tertiary care center like [facility]. -Dr. Whitney Muse to see patient today to determine if course of chemotherapy might be an option. -I wonder what the long-term plan for her nutrition is going to be at this point. Suspect she may need TPN, will await oncology input and recommendations.  Metastatic colon cancer -Awaiting formal oncology input. -Patient is very vague as to why she has not followed up in the past year with her oncologist. -When asked what her most recent treatment was she says "I was supposed to have 10 sessions of radiation but only did 6 because that is all that my insurance would cover". -There are multiple other abnormalities in CT scan including a probable fistula between the sigmoid colon and right pelvic small bowel loop, increased retroperitoneal and right common iliac lymphadenopathy, growth of peritoneal tumor implants in the anterior right lower quadrant and left rectus muscle sheath. -She has a right percutaneous nephrostomy due to ureteral obstruction from her cancer as well.  Code Status: Full code Family Communication: Discussed with 2 sisters at bedside  Disposition Plan: To be determined   Consultants:  Surgery   oncology     Antibiotics:  None  Subjective: Complains of abdominal pain, would like some ice chips, no nausea since admission  Objective: Filed Vitals:   08/04/15 2207 08/04/15 2230 08/04/15 2311 08/05/15 0436  BP: 121/59 122/58 118/56 121/54  Pulse: 79 68 66 73  Temp: 98 F (36.7 C)  98 F (36.7 C) 98.1 F (36.7 C)  TempSrc: Oral  Oral Oral  Resp: 16  18 20   Height:    5\' 2"  (1.575 m)   Weight:   50.984 kg (112 lb 6.4 oz) 51.166 kg (112 lb 12.8 oz)  SpO2: 98% 99% 99% 99%    Intake/Output Summary (Last 24 hours) at 08/05/15 1144 Last data filed at 08/05/15 0800  Gross per 24 hour  Intake 1841.67 ml  Output      0 ml  Net 1841.67 ml   Filed Weights   08/04/15 1237 08/04/15 2311 08/05/15 0436  Weight: 50.349 kg (111 lb) 50.984 kg (112 lb 6.4 oz) 51.166 kg (112 lb 12.8 oz)    Exam:   General:  AA Ox3  Cardiovascular: RRR  Respiratory: CTA B  Abdomen: S/ND/+BS  Extremities: no C/C/E   Neurologic:  Intact, non-focal  Data Reviewed: Basic Metabolic Panel:  Recent Labs Lab 08/04/15 1455  NA 140  K 4.1  CL 102  CO2 26  GLUCOSE 91  BUN 10  CREATININE 0.83  CALCIUM 9.8   Liver Function Tests:  Recent Labs Lab 08/04/15 1455  AST 11*  ALT 10*  ALKPHOS 64  BILITOT 1.2  PROT 7.8  ALBUMIN 3.9    Recent Labs Lab 08/04/15 1455  LIPASE 23   No results for input(s): AMMONIA in the last 168 hours. CBC:  Recent Labs Lab 08/04/15 1455  WBC 6.3  HGB 14.0  HCT 43.7  MCV 85.2  PLT 273   Cardiac Enzymes: No results for input(s): CKTOTAL, CKMB, CKMBINDEX, TROPONINI in the last 168 hours. BNP (last 3 results) No results for input(s): BNP in the last 8760 hours.  ProBNP (last 3 results) No results for input(s): PROBNP in the last 8760 hours.  CBG: No results for input(s): GLUCAP in the last 168 hours.  No results found for this or any previous visit (from the past 240 hour(s)).   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 EXAM: CT ABDOMEN AND PELVIS WITH CONTRAST TECHNIQUE: Multidetector CT imaging of the abdomen and pelvis was performed using the standard protocol following bolus administration of intravenous contrast. CONTRAST:  168mL OMNIPAQUE IOHEXOL  300 MG/ML  SOLN COMPARISON:  07/24/2014 CT abdomen/pelvis. FINDINGS: Lower chest: No significant pulmonary nodules or acute consolidative airspace disease. Hepatobiliary: Stable 3.4 cm segment 4A, 1.2 cm segment 4B and 0.9 cm segment 6 liver hemangiomas, all unchanged since 07/05/2011. No new liver masses. Stable focal adenomyomatosis in the gallbladder fundus. Stable 0.5 cm dense focus in the dependent gallbladder wall, either a polyp or adherent stone. Otherwise no gallbladder wall thickening or pericholecystic fluid. No biliary ductal dilatation. Pancreas: Normal, with no mass or duct dilation. Spleen: Normal size. No mass. Adrenals/Urinary Tract: Normal adrenals. No hydronephrosis. Right percutaneous nephrostomy tube is stable in position with the distal pigtail within the right renal pelvis. Expected small amount of gas in the right renal collecting system from instrumentation. Stable asymmetric atrophy of the right kidney. Simple 1.5 cm renal cyst in the posterior upper left kidney. There is dilatation of the lumbar segment of the right ureter. Normal bladder. Stomach/Bowel: Grossly normal stomach. There is moderate dilatation of the proximal and mid small bowel to the level of the right pelvic small bowel, with a focal small bowel caliber transition in the right pelvis (series 2/ image 45 and series 3/ image 42), which is adjacent to the 3.0 x 1.8 cm right pelvic sidewall heterogeneous mass (series 2/ image 51), which is decreased in size from 6.9 x 5.9 cm on 07/24/2014. 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 Stable postsurgical changes in the right abdomen status post ileocecal resection with stable appearance of the neo ileocolic anastomosis. Circumferential wall thickening in the mid to distal sigmoid colon is nonspecific and most suggestive of colitis. Vascular/Lymphatic: Atherosclerotic nonaneurysmal  abdominal aorta. Patent portal, splenic, hepatic and renal veins. There is new mild-to-moderate aortocaval and right common iliac lymphadenopathy, for example a 1.5 cm aortocaval node (series 2/ image 25) and a 1.3 cm right common iliac node (series 2/ image 37). Reproductive: Status post hysterectomy, with no new abnormality at the vaginal cuff. Other: No pneumoperitoneum. Left deep pelvic heterogeneous 3.0 x 2.8 cm mass (series 2/ image 57), previously 3.9 x 2.5 cm, not definitely changed. Left anterior peritoneal 1.6 cm tumor implant adjacent to the left rectus muscle sheath (series 2/image 45), increased from 0.7 cm. Anterior right lower quadrant 2.3 x 2.1 cm peritoneal tumor implant (series 2/image 42), previously 1.0 x 1.0 cm, increased. Musculoskeletal: No aggressive appearing focal osseous lesions. Mild degenerative changes in the visualized thoracolumbar spine. IMPRESSION: 1. Distal small bowel obstruction at the level of the right pelvic sidewall tumor implant, which is decreased in size since 07/24/2014. 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 Wall thickening in the mid to distal sigmoid colon suggests a nonspecific sigmoid colitis. 2. New retroperitoneal and right common iliac lymphadenopathy,  probably metastatic . 3. Interval growth of peritoneal tumor implants in the anterior right lower quadrant and abutting the left rectus muscle sheath. Stable left pelvic side wall peritoneal tumor implant. 4. No hydronephrosis. Stable well-positioned right percutaneous nephrostomy tube. Stable asymmetric atrophy of the right kidney. Electronically Signed   By: Ilona Sorrel M.D.   On: 08/04/2015 17:58    Scheduled Meds: . sodium chloride   Intravenous STAT  . enoxaparin (LOVENOX) injection  40 mg Subcutaneous Q24H   Continuous Infusions: . sodium chloride 100 mL/hr at 08/05/15 1143    Principal Problem:   Abdominal pain Active Problems:    Colon cancer metastasized to multiple sites [facility])   SBO (small bowel obstruction) (HCC)   Volume depletion   Weight loss   Nausea & vomiting   Colitis   Colo-enteric fistula   Peritoneal carcinomatosis ([place])    Time spent: 25 minutes. Greater than 50% of this time was spent in direct contact with the patient coordinating care.    Lelon Frohlich  [facility] Pager [phone]  If 7PM-7AM, please contact night-coverage at [email or web address], password [facility] 08/05/2015, 11:44 AM  LOS: 1 day

## 2015-08-05 NOTE — Care Management Note (Signed)
Case Management Note  Patient Details  Name: MISHAYLA JACQUES MRN: [id number] Date of Birth: Dec 21, 1950  Subjective/Objective:                  Pt admitted with SBO. Pt is from home, lives with husband and is ind with ADL's. Pt has no HH services or DME prior to admission. Pt is not homebound. Pt plans to return home with self care.   Action/Plan: No CM needs.   Expected Discharge Date:      08/08/2015            Expected Discharge Plan:  Home/Self Care  In-House Referral:  NA  Discharge planning Services  CM Consult  Post Acute Care Choice:  NA Choice offered to:  NA  DME Arranged:    DME Agency:     HH Arranged:    HH Agency:     Status of Service:  Completed, signed off  Medicare Important Message Given:    Date Medicare IM Given:    Medicare IM give by:    Date Additional Medicare IM Given:    Additional Medicare Important Message give by:     If discussed at [place] of Stay Meetings, dates discussed:    Additional Comments:  Sherald Barge, RN 08/05/2015, 10:55 AM

## 2015-08-05 NOTE — Consult Note (Signed)
Reason for Consult: Bowel obstruction, metastatic colon cancer Referring Physician: Hospitalist  Julie Galvan is an 65 y.o. female.  HPI: Patient is a 65 year old white female who originally underwent an appendectomy at [facility] in 2013 and was found to have a malignancy. She subsequently underwent a right hemicolectomy. She was started on chemotherapy at [facility] but could not finish the treatment. She also states she underwent radiation therapy. She is status post a right nephrostomy tube placement for ureteral obstruction. She last saw [facility] oncology last year. She states she is having worsening abdominal pain. She denies any emesis.  Past Medical History  Diagnosis Date  . Hypertension   . Heart palpitations     intermittent" especially if forgets to take Metoprolol."  . Anxiety   . H/O seasonal allergies 11-20-13    "causes coughing"  . Chronic kidney disease     hx. of kidney stones and right ureteral stent placed due to mestatic cancer  . GERD (gastroesophageal reflux disease)   . Arthritis     fingers  . Colon cancer metastasized to multiple sites [facility])     Cecum T3N0M1  . Peritoneal carcinomatosis [facility])     Past Surgical History  Procedure Laterality Date  . Ureteral stent placement Right 11-21-13    done at [facility]  . Appendectomy  Jan 2013    1'13 dx. cancer  . Colon surgery Right 08/18/2011    colon resection for cancer and agin for obstruction  . Abdominal hysterectomy    . Breast surgery      left breast lumpectomy-benign-40 yrs ago  . Portacath placement N/A 11/22/2013    Procedure: INSERTION PORT-A-CATH LEFT SUBCLAVIAN VEIN;  Surgeon: Shann Medal, MD;  Location: [facility] ORS;  Service: General;  Laterality: N/A;    Family History  Problem Relation Age of Onset  . Cancer Mother   . Stroke Father     Social History:  reports that she quit smoking about 22 months ago. Her smoking use included Cigarettes. She does not have  any smokeless tobacco history on file. She reports that she does not drink alcohol or use illicit drugs.  Allergies:  Allergies  Allergen Reactions  . Sulfa Antibiotics Hives and Other (See Comments)    Headache     Medications: I have reviewed the patient's current medications.  Results for orders placed or performed during the hospital encounter of 08/04/15 (from the past 48 hour(s))  Urinalysis, Routine w reflex microscopic (not at [facility])     Status: Abnormal   Collection Time: 08/04/15  2:20 PM  Result Value Ref Range   Color, Urine AMBER (A) YELLOW    Comment: BIOCHEMICALS MAY BE AFFECTED BY COLOR   APPearance HAZY (A) CLEAR   Specific Gravity, Urine >1.030 (H) 1.005 - 1.030   pH 5.5 5.0 - 8.0   Glucose, UA NEGATIVE NEGATIVE mg/dL   Hgb urine dipstick SMALL (A) NEGATIVE   Bilirubin Urine SMALL (A) NEGATIVE   Ketones, ur TRACE (A) NEGATIVE mg/dL   Protein, ur 100 (A) NEGATIVE mg/dL   Nitrite NEGATIVE NEGATIVE   Leukocytes, UA NEGATIVE NEGATIVE  Urine microscopic-add on     Status: Abnormal   Collection Time: 08/04/15  2:20 PM  Result Value Ref Range   Squamous Epithelial / LPF 0-5 (A) NONE SEEN   WBC, UA 0-5 0 - 5 WBC/hpf   RBC / HPF 6-30 0 - 5 RBC/hpf   Bacteria, UA FEW (A) NONE SEEN  Urine-Other MUCOUS PRESENT   Lipase, blood     Status: None   Collection Time: 08/04/15  2:55 PM  Result Value Ref Range   Lipase 23 11 - 51 U/L  Comprehensive metabolic panel     Status: Abnormal   Collection Time: 08/04/15  2:55 PM  Result Value Ref Range   Sodium 140 135 - 145 mmol/L   Potassium 4.1 3.5 - 5.1 mmol/L   Chloride 102 101 - 111 mmol/L   CO2 26 22 - 32 mmol/L   Glucose, Bld 91 65 - 99 mg/dL   BUN 10 6 - 20 mg/dL   Creatinine, Ser 0.83 0.44 - 1.00 mg/dL   Calcium 9.8 8.9 - 10.3 mg/dL   Total Protein 7.8 6.5 - 8.1 g/dL   Albumin 3.9 3.5 - 5.0 g/dL   AST 11 (L) 15 - 41 U/L   ALT 10 (L) 14 - 54 U/L   Alkaline Phosphatase 64 38 - 126 U/L   Total Bilirubin 1.2 0.3 -  1.2 mg/dL   GFR calc non Af Amer >60 >60 mL/min   GFR calc Af Amer >60 >60 mL/min    Comment: (NOTE) The eGFR has been calculated using the CKD EPI equation. This calculation has not been validated in all clinical situations. eGFR's persistently <60 mL/min signify possible Chronic Kidney Disease.    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 EXAM: CT ABDOMEN AND PELVIS WITH CONTRAST TECHNIQUE: Multidetector CT imaging of the abdomen and pelvis was performed using the standard protocol following bolus administration of intravenous contrast. CONTRAST:  154m OMNIPAQUE IOHEXOL 300 MG/ML  SOLN COMPARISON:  07/24/2014 CT abdomen/pelvis. FINDINGS: Lower chest: No significant pulmonary nodules or acute consolidative airspace disease. Hepatobiliary: Stable 3.4 cm segment 4A, 1.2 cm segment 4B and 0.9 cm segment 6 liver hemangiomas, all unchanged since 07/05/2011. No new liver masses. Stable focal adenomyomatosis in the gallbladder fundus. Stable 0.5 cm dense focus in the dependent gallbladder wall, either a polyp or adherent stone. Otherwise no gallbladder wall thickening or pericholecystic fluid. No biliary ductal dilatation. Pancreas: Normal, with no mass or duct dilation. Spleen: Normal size. No mass. Adrenals/Urinary Tract:  Normal adrenals. No hydronephrosis. Right percutaneous nephrostomy tube is stable in position with the distal pigtail within the right renal pelvis. Expected small amount of gas in the right renal collecting system from instrumentation. Stable asymmetric atrophy of the right kidney. Simple 1.5 cm renal cyst in the posterior upper left kidney. There is dilatation of the lumbar segment of the right ureter. Normal bladder. Stomach/Bowel: Grossly normal stomach. There  is moderate dilatation of the proximal and mid small bowel to the level of the right pelvic small bowel, with a focal small bowel caliber transition in the right pelvis (series 2/ image 45 and series 3/ image 42), which is adjacent to the 3.0 x 1.8 cm right pelvic sidewall heterogeneous mass (series 2/ image 51), which is decreased in size from 6.9 x 5.9 cm on 07/24/2014. 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 Stable postsurgical changes in the right abdomen status post ileocecal resection with stable appearance of the neo ileocolic anastomosis. Circumferential wall thickening in the mid to distal sigmoid colon is nonspecific and most suggestive of colitis. Vascular/Lymphatic: Atherosclerotic nonaneurysmal abdominal aorta. Patent portal, splenic, hepatic and renal veins. There is new mild-to-moderate aortocaval and right common iliac lymphadenopathy, for example a 1.5 cm aortocaval node (series 2/ image 25) and a 1.3 cm right common iliac node (series 2/ image 37). Reproductive: Status post hysterectomy, with no new abnormality at the vaginal cuff. Other: No pneumoperitoneum. Left deep pelvic heterogeneous 3.0 x 2.8 cm mass (series 2/ image 57), previously 3.9 x 2.5 cm, not definitely changed. Left anterior peritoneal 1.6 cm tumor implant adjacent to the left rectus muscle sheath (series 2/image 45), increased from 0.7 cm. Anterior right lower quadrant 2.3 x  2.1 cm peritoneal tumor implant (series 2/image 42), previously 1.0 x 1.0 cm, increased. Musculoskeletal: No aggressive appearing focal osseous lesions. Mild degenerative changes in the visualized thoracolumbar spine. IMPRESSION: 1. Distal small bowel obstruction at the level of the right pelvic sidewall tumor implant, which is decreased in size since 07/24/2014. 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 Wall thickening in the mid to distal sigmoid colon suggests a nonspecific sigmoid colitis. 2. New retroperitoneal and right common iliac lymphadenopathy, probably metastatic . 3. Interval growth of peritoneal tumor implants in the anterior right lower quadrant and abutting the left rectus muscle sheath. Stable left pelvic side wall peritoneal tumor implant. 4. No hydronephrosis. Stable well-positioned right percutaneous nephrostomy tube. Stable asymmetric atrophy of the right kidney. Electronically Signed   By: Ilona Sorrel M.D.   On: 08/04/2015 17:58    ROS: See chart Blood pressure 121/54, pulse 73, temperature 98.1 F (36.7 C), temperature source Oral, resp. rate 20, height 5' 2"  (1.575 m), weight 51.166 kg (112 lb 12.8 oz), SpO2 99 %. Physical Exam: Pleasant white female in no acute distress. Abdomen is soft with tenderness throughout. She does not have rigidity. I could not appreciate hepatosplenomegaly. Occasional bowel sounds appreciated.  Assessment/Plan: Impression: Carcinomatosis, partial treatment of metastatic colon cancer. Plan: I had extensive discussion with the patient and the sister. I don't know really why she has not been followed more closely at [facility]. She came to [facility] due to a bad experience at [facility]. She has multiple intra-abdominal issues which would preclude any surgical intervention here [facility]. I have mentioned to them that if she needed surgery, this will need to be done at a tertiary care  center. Awaiting input from Dr. Whitney Muse of oncology.  Heyward Douthit A 08/05/2015, 8:27 AM

## 2015-08-06 ENCOUNTER — Inpatient Hospital Stay (HOSPITAL_COMMUNITY): Payer: BLUE CROSS/BLUE SHIELD

## 2015-08-06 DIAGNOSIS — C7800 Secondary malignant neoplasm of unspecified lung: Secondary | ICD-10-CM

## 2015-08-06 DIAGNOSIS — E440 Moderate protein-calorie malnutrition: Secondary | ICD-10-CM

## 2015-08-06 DIAGNOSIS — K529 Noninfective gastroenteritis and colitis, unspecified: Secondary | ICD-10-CM

## 2015-08-06 DIAGNOSIS — K632 Fistula of intestine: Secondary | ICD-10-CM

## 2015-08-06 DIAGNOSIS — C7989 Secondary malignant neoplasm of other specified sites: Secondary | ICD-10-CM

## 2015-08-06 DIAGNOSIS — C189 Malignant neoplasm of colon, unspecified: Secondary | ICD-10-CM

## 2015-08-06 LAB — URINE CULTURE

## 2015-08-06 LAB — CEA: CEA: 28.4 ng/mL — ABNORMAL HIGH (ref 0.0–4.7)

## 2015-08-06 MED ORDER — RANITIDINE HCL 150 MG/10ML PO SYRP
150.0000 mg | ORAL_SOLUTION | Freq: Two times a day (BID) | ORAL | Status: DC
  Administered 2015-08-06 – 2015-08-07 (×3): 150 mg via ORAL
  Filled 2015-08-06 (×5): qty 10

## 2015-08-06 MED ORDER — BOOST / RESOURCE BREEZE PO LIQD
1.0000 | Freq: Three times a day (TID) | ORAL | Status: DC
  Administered 2015-08-06 – 2015-08-07 (×2): 1 via ORAL

## 2015-08-06 NOTE — Progress Notes (Signed)
Pt had a 2.2 lb weight gain. She was 51.16 on the 14th and 52.39 today

## 2015-08-06 NOTE — Progress Notes (Signed)
Subjective: Patient feels much better. Tolerating full liquid diet the present time.  Objective: Vital signs in last 24 hours: Temp:  [98 F (36.7 C)-98.3 F (36.8 C)] 98.3 F (36.8 C) (02/15 0430) Pulse Rate:  [68-78] 78 (02/15 0430) Resp:  [18-20] 20 (02/14 2302) BP: (130-151)/(56-61) 151/61 mmHg (02/15 0430) SpO2:  [94 %-98 %] 98 % (02/15 0430) Weight:  [52.39 kg (115 lb 8 oz)] 52.39 kg (115 lb 8 oz) (02/15 0436) Last BM Date: 08/05/15  Intake/Output from previous day: 02/14 0701 - 02/15 0700 In: 1471 [P.O.:480; I.V.:991] Out: 400 [Urine:400] Intake/Output this shift:    General appearance: alert, cooperative and no distress GI: Soft. No specific tenderness noted. No rigidity noted. No distention noted.  Lab Results:   Recent Labs  08/04/15 1455  WBC 6.3  HGB 14.0  HCT 43.7  PLT 273   BMET  Recent Labs  08/04/15 1455  NA 140  K 4.1  CL 102  CO2 26  GLUCOSE 91  BUN 10  CREATININE 0.83  CALCIUM 9.8   PT/INR No results for input(s): LABPROT, INR in the last 72 hours.  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 EXAM: CT ABDOMEN AND PELVIS WITH CONTRAST TECHNIQUE: Multidetector CT imaging of the abdomen and pelvis was performed using the standard protocol following bolus administration of intravenous contrast. CONTRAST:  11mL OMNIPAQUE IOHEXOL 300 MG/ML  SOLN COMPARISON:  07/24/2014 CT abdomen/pelvis. FINDINGS: Lower chest: No significant pulmonary nodules or acute consolidative airspace disease. Hepatobiliary: Stable 3.4 cm segment 4A, 1.2 cm segment 4B and 0.9 cm segment 6 liver hemangiomas, all unchanged since 07/05/2011. No new liver masses. Stable focal adenomyomatosis in the gallbladder fundus. Stable 0.5 cm dense focus in the dependent gallbladder wall, either a polyp or adherent  stone. Otherwise no gallbladder wall thickening or pericholecystic fluid. No biliary ductal dilatation. Pancreas: Normal, with no mass or duct dilation. Spleen: Normal size. No mass. Adrenals/Urinary Tract: Normal adrenals. No hydronephrosis. Right percutaneous nephrostomy tube is stable in position with the distal pigtail within the right renal pelvis. Expected small amount of gas in the right renal collecting system from instrumentation. Stable asymmetric atrophy of the right kidney. Simple 1.5 cm renal cyst in the posterior upper left kidney. There is dilatation of the lumbar segment of the right ureter. Normal bladder. Stomach/Bowel: Grossly normal stomach. There is moderate dilatation of the proximal and mid small bowel to the level of the right pelvic small bowel, with a focal small bowel caliber transition in the right pelvis (series 2/ image 45 and series 3/ image 42), which is adjacent to the 3.0 x 1.8 cm right pelvic sidewall heterogeneous mass (series 2/ image 51), which is decreased in size from 6.9 x 5.9 cm on 07/24/2014. 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 Stable postsurgical changes in the right abdomen status post ileocecal resection with stable appearance of the neo ileocolic anastomosis. Circumferential wall thickening in the mid to distal sigmoid colon is nonspecific and most suggestive of colitis. Vascular/Lymphatic: Atherosclerotic nonaneurysmal abdominal aorta. Patent portal, splenic, hepatic and renal veins. There is new mild-to-moderate aortocaval and right common iliac lymphadenopathy, for example a 1.5 cm aortocaval node (series 2/ image 25) and a 1.3 cm right common iliac node (series 2/ image 37). Reproductive: Status  post hysterectomy, with no new abnormality at the vaginal cuff. Other: No pneumoperitoneum. Left deep pelvic heterogeneous 3.0 x 2.8 cm mass (series 2/ image 57), previously  3.9 x 2.5 cm, not definitely changed. Left anterior peritoneal 1.6 cm tumor implant adjacent to the left rectus muscle sheath (series 2/image 45), increased from 0.7 cm. Anterior right lower quadrant 2.3 x 2.1 cm peritoneal tumor implant (series 2/image 42), previously 1.0 x 1.0 cm, increased. Musculoskeletal: No aggressive appearing focal osseous lesions. Mild degenerative changes in the visualized thoracolumbar spine. IMPRESSION: 1. Distal small bowel obstruction at the level of the right pelvic sidewall tumor implant, which is decreased in size since 07/24/2014. 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 Wall thickening in the mid to distal sigmoid colon suggests a nonspecific sigmoid colitis. 2. New retroperitoneal and right common iliac lymphadenopathy, probably metastatic . 3. Interval growth of peritoneal tumor implants in the anterior right lower quadrant and abutting the left rectus muscle sheath. Stable left pelvic side wall peritoneal tumor implant. 4. No hydronephrosis. Stable well-positioned right percutaneous nephrostomy tube. Stable asymmetric atrophy of the right kidney. Electronically Signed   By: Ilona Sorrel M.D.   On: 08/04/2015 17:58    Anti-infectives: Anti-infectives    Start     Dose/Rate Route Frequency Ordered Stop   08/04/15 1830  ciprofloxacin (CIPRO) IVPB 400 mg     400 mg 200 mL/hr over 60 Minutes Intravenous  Once 08/04/15 1815 08/04/15 2206   08/04/15 1830  metroNIDAZOLE (FLAGYL) IVPB 500 mg     500 mg 100 mL/hr over 60 Minutes Intravenous  Once 08/04/15 1815 08/04/15 2033      Assessment/Plan: Impression: Partial bowel obstruction secondary to metastatic adenocarcinoma, carcinomatosis. Resolving. Plan: Continue full liquid diet for today. Patient was to see how she does on this regimen. She has tried ensuring the past but states that it made her stomach upset. Appreciate oncology input.  LOS: 2 days     Julie Galvan A 08/06/2015

## 2015-08-06 NOTE — Progress Notes (Signed)
[facility] PROGRESS NOTE  Julie Galvan [id number] DOB: 13-Jan-1951    PCP:   [name], MD   HPI: Julie Galvan is an 65 y.o. female admitted with SBO due to abdominal carcinomatosis from spreading colon CA.  She has seen surgery and oncology, and has been able to tolerate full liquid diet.  She is still not meeting her caloric needs, but does NOT want TPN, PPN, or entero TF.  I discussed about the code status, and she confirmed unequivocally that she would like to be a DNR, should she go into a cardiopulmonary arrest.  Some abdominal pain, but she is determined to try to eat more.    Rewiew of Systems:  Constitutional: Negative for malaise, fever and chills. No significant weight loss or weight gain Eyes: Negative for eye pain, redness and discharge, diplopia, visual changes, or flashes of light. ENMT: Negative for ear pain, hoarseness, nasal congestion, sinus pressure and sore throat. No headaches; tinnitus, drooling, or problem swallowing. Cardiovascular: Negative for chest pain, palpitations, diaphoresis, dyspnea and peripheral edema. ; No orthopnea, PND Respiratory: Negative for cough, hemoptysis, wheezing and stridor. No pleuritic chestpain. Gastrointestinal: Negative for nausea, vomiting, diarrhea, constipation, abdominal pain, melena, blood in stool, hematemesis, jaundice and rectal bleeding.    Genitourinary: Negative for frequency, dysuria, incontinence,flank pain and hematuria; Musculoskeletal: Negative for back pain and neck pain. Negative for swelling and trauma.;  Skin: . Negative for pruritus, rash, abrasions, bruising and skin lesion.; ulcerations Neuro: Negative for headache, lightheadedness and neck stiffness. Negative for weakness, altered level of consciousness , altered mental status, extremity weakness, burning feet, involuntary movement, seizure and syncope.  Psych: negative for anxiety, depression, insomnia, tearfulness, panic attacks, hallucinations,  paranoia, suicidal or homicidal ideation    Past Medical History  Diagnosis Date  . Hypertension   . Heart palpitations     intermittent" especially if forgets to take Metoprolol."  . Anxiety   . H/O seasonal allergies 11-20-13    "causes coughing"  . Chronic kidney disease     hx. of kidney stones and right ureteral stent placed due to mestatic cancer  . GERD (gastroesophageal reflux disease)   . Arthritis     fingers  . Colon cancer metastasized to multiple sites [facility])     Cecum [id number]  . Peritoneal carcinomatosis [facility])     Past Surgical History  Procedure Laterality Date  . Ureteral stent placement Right 11-21-13    done at [facility]  . Appendectomy  Jan 2013    1'13 dx. cancer  . Colon surgery Right 08/18/2011    colon resection for cancer and agin for obstruction  . Abdominal hysterectomy    . Breast surgery      left breast lumpectomy-benign-40 yrs ago  . Portacath placement N/A 11/22/2013    Procedure: INSERTION PORT-A-CATH LEFT SUBCLAVIAN VEIN;  Surgeon: Shann Medal, MD;  Location: [facility];  Service: General;  Laterality: N/A;    Medications:  HOME MEDS: Prior to Admission medications   Medication Sig Start Date End Date Taking? Authorizing Provider  Calcium Carbonate-Vit D-Min (CALTRATE 600+D PLUS MINERALS) 600-800 MG-UNIT CHEW Chew 1 tablet by mouth 2 (two) times a week.    Yes Historical Provider, MD  Frankincense OIL Take 1 capsule by mouth every 3 (three) days.    Yes Historical Provider, MD  metoprolol tartrate (LOPRESSOR) 25 MG tablet Take 25 mg by mouth 2 (two) times daily.   Yes Historical Provider, MD  oxyCODONE (OXY IR/ROXICODONE) 5 MG immediate  release tablet Take 5 mg by mouth every 6 (six) hours as needed for moderate pain or severe pain.   Yes Historical Provider, MD  ranitidine (ZANTAC) 150 MG tablet Take 150 mg by mouth 2 (two) times daily.   Yes Historical Provider, MD     Allergies:  Allergies  Allergen Reactions  . Sulfa Antibiotics Hives  and Other (See Comments)    Headache     Social History:   reports that she quit smoking about 22 months ago. Her smoking use included Cigarettes. She does not have any smokeless tobacco history on file. She reports that she does not drink alcohol or use illicit drugs.  Family History: Family History  Problem Relation Age of Onset  . Cancer Mother   . Stroke Father      Physical Exam: Filed Vitals:   08/05/15 1735 08/05/15 2302 08/06/15 0430 08/06/15 0436  BP:  134/56 151/61   Pulse:  69 78   Temp:  98.2 F (36.8 C) 98.3 F (36.8 C)   TempSrc:  Oral Oral   Resp:  20    Height:      Weight:    52.39 kg (115 lb 8 oz)  SpO2: 94% 96% 98%    Blood pressure 151/61, pulse 78, temperature 98.3 F (36.8 C), temperature source Oral, resp. rate 20, height 5\' 2"  (1.575 m), weight 52.39 kg (115 lb 8 oz), SpO2 98 %.  GEN:  Pleasant  patient lying in the stretcher in no acute distress; cooperative with exam. PSYCH:  alert and oriented x4; does not appear anxious or depressed; affect is appropriate. HEENT: Mucous membranes pink and anicteric; PERRLA; EOM intact; no cervical lymphadenopathy nor thyromegaly or carotid bruit; no JVD; There were no stridor. Neck is very supple. Breasts:: Not examined CHEST WALL: No tenderness CHEST: Normal respiration, clear to auscultation bilaterally.  HEART: Regular rate and rhythm.  There are no murmur, rub, or gallops.   BACK: No kyphosis or scoliosis; no CVA tenderness ABDOMEN: soft and non-tender; no masses, no organomegaly, normal abdominal bowel sounds; no pannus; no intertriginous candida. There is no rebound and no distention. Rectal Exam: Not done EXTREMITIES: No bone or joint deformity; age-appropriate arthropathy of the hands and knees; no edema; no ulcerations.  There is no calf tenderness. Genitalia: not examined PULSES: 2+ and symmetric SKIN: Normal hydration no rash or ulceration CNS: Cranial nerves 2-12 grossly intact no focal  lateralizing neurologic deficit.  Speech is fluent; uvula elevated with phonation, facial symmetry and tongue midline. DTR are normal bilaterally, cerebella exam is intact, barbinski is negative and strengths are equaled bilaterally.  No sensory loss.   Labs on Admission:  Basic Metabolic Panel:  Recent Labs Lab 08/04/15 1455  NA 140  K 4.1  CL 102  CO2 26  GLUCOSE 91  BUN 10  CREATININE 0.83  CALCIUM 9.8   Liver Function Tests:  Recent Labs Lab 08/04/15 1455  AST 11*  ALT 10*  ALKPHOS 64  BILITOT 1.2  PROT 7.8  ALBUMIN 3.9    Recent Labs Lab 08/04/15 1455  LIPASE 23   No results for input(s): AMMONIA in the last 168 hours. CBC:  Recent Labs Lab 08/04/15 1455  WBC 6.3  HGB 14.0  HCT 43.7  MCV 85.2  PLT 273   Cardiac Enzymes: No results for input(s): CKTOTAL, CKMB, CKMBINDEX, TROPONINI in the last 168 hours.  CBG: No results for input(s): GLUCAP in the last 168 hours.   Radiological Exams on Admission:  Ct Chest Wo Contrast  08/06/2015  CLINICAL DATA:  Staging metastatic colon cancer, weight loss, vomiting and diarrhea. EXAM: CT CHEST WITHOUT CONTRAST TECHNIQUE: Multidetector CT imaging of the chest was performed following the standard protocol without IV contrast. COMPARISON:  CT abdomen pelvis 08/04/2015 and PET 10/31/2013. FINDINGS: Mediastinum/Nodes: Left subclavian Port-A-Cath terminates in the SVC. No pathologically enlarged mediastinal or axillary lymph nodes. Hilar regions are difficult to definitively evaluate without IV contrast. Heart size normal. Left anterior descending coronary artery calcification. No pericardial effusion. Lymph node adjacent to the lower descending thoracic aorta measures 9 mm. Lungs/Pleura: Scattered pulmonary nodules appear new from 10/31/2013. Index nodule in the subpleural right lower lobe measures 7 x 12 mm (series 3, image 40). No pleural fluid. Airway is unremarkable. Upper abdomen: Hepatic and left renal lesions, as well  as retroperitoneal adenopathy, better seen on diagnostic examination of 08/04/2015. Musculoskeletal: Scattered sclerotic lesions in the spine appear to have been present on 10/31/2013. Degenerative changes are seen in the spine as well. IMPRESSION: 1. Scattered pulmonary nodules and distal periaortic adenopathy, new from 10/31/2013, indicative of metastatic disease. 2. Retroperitoneal adenopathy better seen on 08/04/2015. 3. Left anterior descending coronary artery calcification. Electronically Signed   By: Lorin Picket M.D.   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 EXAM: CT ABDOMEN AND PELVIS WITH CONTRAST TECHNIQUE: Multidetector CT imaging of the abdomen and pelvis was performed using the standard protocol following bolus administration of intravenous contrast. CONTRAST:  188mL OMNIPAQUE IOHEXOL 300 MG/ML  SOLN COMPARISON:  07/24/2014 CT abdomen/pelvis. FINDINGS: Lower chest: No significant pulmonary nodules or acute consolidative airspace disease. Hepatobiliary: Stable 3.4 cm segment 4A, 1.2 cm segment 4B and 0.9 cm segment 6 liver hemangiomas, all unchanged since 07/05/2011. No new liver masses. Stable focal adenomyomatosis in the gallbladder fundus. Stable 0.5 cm dense focus in the dependent gallbladder wall, either a polyp or adherent stone. Otherwise no gallbladder wall thickening or pericholecystic fluid. No biliary ductal dilatation. Pancreas: Normal, with no mass or duct dilation. Spleen: Normal size. No mass. Adrenals/Urinary Tract: Normal adrenals. No hydronephrosis. Right percutaneous nephrostomy tube is stable in position with the distal pigtail within the right renal pelvis. Expected small amount of gas in the right renal collecting system from instrumentation. Stable asymmetric atrophy of the right kidney. Simple  1.5 cm renal cyst in the posterior upper left kidney. There is dilatation of the lumbar segment of the right ureter. Normal bladder. Stomach/Bowel: Grossly normal stomach. There is moderate dilatation of the proximal and mid small bowel to the level of the right pelvic small bowel, with a focal small bowel caliber transition in the right pelvis (series 2/ image 45 and series 3/ image 42), which is adjacent to the 3.0 x 1.8 cm right pelvic sidewall heterogeneous mass (series 2/ image 51), which is decreased in size from 6.9 x 5.9 cm on 07/24/2014. 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 Stable postsurgical changes in the right abdomen status post ileocecal resection with stable appearance of the neo ileocolic anastomosis. Circumferential wall thickening in the mid to distal sigmoid colon is nonspecific and most suggestive of colitis. Vascular/Lymphatic: Atherosclerotic nonaneurysmal abdominal aorta. Patent portal, splenic, hepatic and renal veins. There is new mild-to-moderate aortocaval and right common iliac lymphadenopathy, for  example a 1.5 cm aortocaval node (series 2/ image 25) and a 1.3 cm right common iliac node (series 2/ image 37). Reproductive: Status post hysterectomy, with no new abnormality at the vaginal cuff. Other: No pneumoperitoneum. Left deep pelvic heterogeneous 3.0 x 2.8 cm mass (series 2/ image 57), previously 3.9 x 2.5 cm, not definitely changed. Left anterior peritoneal 1.6 cm tumor implant adjacent to the left rectus muscle sheath (series 2/image 45), increased from 0.7 cm. Anterior right lower quadrant 2.3 x 2.1 cm peritoneal tumor implant (series 2/image 42), previously 1.0 x 1.0 cm, increased. Musculoskeletal: No aggressive appearing focal osseous lesions. Mild degenerative changes in the visualized thoracolumbar spine. IMPRESSION: 1. Distal small bowel obstruction at the level of the right  pelvic sidewall tumor implant, which is decreased in size since 07/24/2014. 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 Wall thickening in the mid to distal sigmoid colon suggests a nonspecific sigmoid colitis. 2. New retroperitoneal and right common iliac lymphadenopathy, probably metastatic . 3. Interval growth of peritoneal tumor implants in the anterior right lower quadrant and abutting the left rectus muscle sheath. Stable left pelvic side wall peritoneal tumor implant. 4. No hydronephrosis. Stable well-positioned right percutaneous nephrostomy tube. Stable asymmetric atrophy of the right kidney. Electronically Signed   By: Ilona Sorrel M.D.   On: 08/04/2015 17:58    Assessment/Plan Present on Admission:  . SBO (small bowel obstruction) ([place]) . Colon cancer metastasized to multiple sites [facility]) . Volume depletion . Weight loss . Nausea & vomiting . Abdominal pain  PLAN:  SBO:  Improving.  Continue with full liquid today.  She doesn't want TPN, PPN or enteroTF.  Will give supplements.  Metastatic Colon CA:  As per oncology.  Code status is determined as DNR.  We will honor her wishes.   GERD:  Continue with PPI.     Other plans as per orders. Code Status: DNR.    Orvan Falconer, MD.  FACP [facility] Pager [phone] 7pm to 7am.  08/06/2015, 11:18 AM

## 2015-08-06 NOTE — Progress Notes (Signed)
Subjective: Patient is in bed with family members at the bedside.    She is feeling great.  She is excited to have her diet advanced.  She is looking forward to dinner tonight.  With her family, she is laughing and having a great time.  As a result, I opted to not review her CT of chest results at this time.  We can address that in the near future, as it does not change her treatment course at this time and she will have a difficult time with handling too much negative information at this time, as she is fearful of treatment, in particular chemotherapy, BUT not opposed to it.  Objective: Vital signs in last 24 hours: Temp:  [98.2 F (36.8 C)-98.9 F (37.2 C)] 98.9 F (37.2 C) (02/15 1441) Pulse Rate:  [69-78] 78 (02/15 1441) Resp:  [20] 20 (02/15 1441) BP: (120-151)/(56-88) 120/88 mmHg (02/15 1441) SpO2:  [94 %-99 %] 99 % (02/15 1441) Weight:  [115 lb 8 oz (52.39 kg)] 115 lb 8 oz (52.39 kg) (02/15 0436)  Intake/Output from previous day: 02/14 0800 - 02/15 0759 In: 1471 [P.O.:480; I.V.:991] Out: 400 [Urine:400] Intake/Output this shift:    General appearance: alert, cooperative, no distress and smiling and laughing with family at the bedside  Lab Results:   Recent Labs  08/04/15 1455  WBC 6.3  HGB 14.0  HCT 43.7  PLT 273   BMET  Recent Labs  08/04/15 1455  NA 140  K 4.1  CL 102  CO2 26  GLUCOSE 91  BUN 10  CREATININE 0.83  CALCIUM 9.8   Lab Results  Component Value Date   CEA 28.4* 08/04/2015    Studies/Results: Ct Chest Wo Contrast  08/06/2015  CLINICAL DATA:  Staging metastatic colon cancer, weight loss, vomiting and diarrhea. EXAM: CT CHEST WITHOUT CONTRAST TECHNIQUE: Multidetector CT imaging of the chest was performed following the standard protocol without IV contrast. COMPARISON:  CT abdomen pelvis 08/04/2015 and PET 10/31/2013. FINDINGS: Mediastinum/Nodes: Left subclavian Port-A-Cath terminates in the SVC. No pathologically enlarged mediastinal or  axillary lymph nodes. Hilar regions are difficult to definitively evaluate without IV contrast. Heart size normal. Left anterior descending coronary artery calcification. No pericardial effusion. Lymph node adjacent to the lower descending thoracic aorta measures 9 mm. Lungs/Pleura: Scattered pulmonary nodules appear new from 10/31/2013. Index nodule in the subpleural right lower lobe measures 7 x 12 mm (series 3, image 40). No pleural fluid. Airway is unremarkable. Upper abdomen: Hepatic and left renal lesions, as well as retroperitoneal adenopathy, better seen on diagnostic examination of 08/04/2015. Musculoskeletal: Scattered sclerotic lesions in the spine appear to have been present on 10/31/2013. Degenerative changes are seen in the spine as well. IMPRESSION: 1. Scattered pulmonary nodules and distal periaortic adenopathy, new from 10/31/2013, indicative of metastatic disease. 2. Retroperitoneal adenopathy better seen on 08/04/2015. 3. Left anterior descending coronary artery calcification. Electronically Signed   By: Lorin Picket M.D.   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 EXAM: CT ABDOMEN AND PELVIS WITH CONTRAST TECHNIQUE: Multidetector CT imaging of the abdomen and pelvis was performed using the standard protocol following bolus administration of intravenous contrast. CONTRAST:  1103m OMNIPAQUE IOHEXOL 300 MG/ML  SOLN COMPARISON:  07/24/2014 CT abdomen/pelvis. FINDINGS: Lower chest: No significant pulmonary nodules or acute consolidative airspace disease. Hepatobiliary: Stable 3.4 cm segment 4A,  1.2 cm segment 4B and 0.9 cm segment 6 liver hemangiomas, all unchanged since 07/05/2011. No new liver masses. Stable focal adenomyomatosis in the gallbladder fundus. Stable 0.5 cm dense focus in the dependent gallbladder wall,  either a polyp or adherent stone. Otherwise no gallbladder wall thickening or pericholecystic fluid. No biliary ductal dilatation. Pancreas: Normal, with no mass or duct dilation. Spleen: Normal size. No mass. Adrenals/Urinary Tract: Normal adrenals. No hydronephrosis. Right percutaneous nephrostomy tube is stable in position with the distal pigtail within the right renal pelvis. Expected small amount of gas in the right renal collecting system from instrumentation. Stable asymmetric atrophy of the right kidney. Simple 1.5 cm renal cyst in the posterior upper left kidney. There is dilatation of the lumbar segment of the right ureter. Normal bladder. Stomach/Bowel: Grossly normal stomach. There is moderate dilatation of the proximal and mid small bowel to the level of the right pelvic small bowel, with a focal small bowel caliber transition in the right pelvis (series 2/ image 45 and series 3/ image 42), which is adjacent to the 3.0 x 1.8 cm right pelvic sidewall heterogeneous mass (series 2/ image 51), which is decreased in size from 6.9 x 5.9 cm on 07/24/2014. 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 Stable postsurgical changes in the right abdomen status post ileocecal resection with stable appearance of the neo ileocolic anastomosis. Circumferential wall thickening in the mid to distal sigmoid colon is nonspecific and most suggestive of colitis. Vascular/Lymphatic: Atherosclerotic nonaneurysmal abdominal aorta. Patent portal, splenic, hepatic and renal veins. There is new mild-to-moderate aortocaval and right common iliac lymphadenopathy, for example a 1.5 cm aortocaval node (series 2/ image 25) and a 1.3 cm right common iliac node (series 2/ image 37). Reproductive: Status post hysterectomy, with no new abnormality at the vaginal cuff. Other: No pneumoperitoneum. Left deep pelvic heterogeneous 3.0 x 2.8 cm mass (series  2/ image 57), previously 3.9 x 2.5 cm, not definitely changed. Left anterior peritoneal 1.6 cm tumor implant adjacent to the left rectus muscle sheath (series 2/image 45), increased from 0.7 cm. Anterior right lower quadrant 2.3 x 2.1 cm peritoneal tumor implant (series 2/image 42), previously 1.0 x 1.0 cm, increased. Musculoskeletal: No aggressive appearing focal osseous lesions. Mild degenerative changes in the visualized thoracolumbar spine. IMPRESSION: 1. Distal small bowel obstruction at the level of the right pelvic sidewall tumor implant, which is decreased in size since 07/24/2014. 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 Wall thickening in the mid to distal sigmoid colon suggests a nonspecific sigmoid colitis. 2. New retroperitoneal and right common iliac lymphadenopathy, probably metastatic . 3. Interval growth of peritoneal tumor implants in the anterior right lower quadrant and abutting the left rectus muscle sheath. Stable left pelvic side wall peritoneal tumor implant. 4. No hydronephrosis. Stable well-positioned right percutaneous nephrostomy tube. Stable asymmetric atrophy of the right kidney. Electronically Signed   By: Ilona Sorrel M.D.   On: 08/04/2015 17:58    Medications: I have reviewed the patient's current medications.  Assessment/Plan: 65 year old white American female who was treated at an outside cancer center by Dr. 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  2 years later, she was diagnosed with severe right hydronephrosis.  Imaging demonstrated bulky  pelvic disease with a right pelvic mass being the cause of her hydronephrosis.  PET imaging demonstrated pelvic hypermetabolic disease on 6/80/3212.  She was treated by  medical oncology at [facility] under the guidance of Dr. Jacquiline Doe with FOLFOX + Avastin, last treated in December 2015 as patient experience poor tolerance to systemic chemotherapy.  Since then, she has undergone palliative radiation by Dr. Tammi Klippel on two separate occassions. EXACT DETAILS ARE UNKNOWN. Documented poor patient compliance in her medical records, in addition to being a poor historian.  Oncology history with available records is developed and follows below.  Her case has been discussed with Dr. Eugenia Pancoast, surgical oncologist, at [facility].  We have been in contact with Dr. Eugenia Pancoast over the past 24 hours in addition to his staff.  They are presently coordinating an outpatient appointment for consultation.  He will consider her for debulking surgery.  Given her retroperitoneal adenopathy and her CT of chest with findings suspicious for pulmonary metastatic disease, she will not be a candidate for HIPEC treatment.  However, her pelvic disease is her biggest morbidity and therefore surgical oncology consultation will be helpful.  CEA is elevated at 28.4 on 08/04/2015.  Recommend advancing diet as tolerated and discharge when medically appropriate.   Her discharge will need to be coordinated with [facility] in order to allow for close outpatient follow-up.  Thank you for allowing oncology to follow this pleasant lady with you.  Patient and plan discussed with Dr. Ancil Linsey and she is in agreement with the aforementioned.     Colon cancer metastasized to multiple sites [facility])   08/18/2011 Initial Diagnosis Status post appendectomy.  Cancer found within specimen.  Underwent followup open right proximal colectomy for per 2013.  T3 N0.  0/16 lymph nodes positive.  Declined followup.   10/18/2013 Imaging CT abd/pelvis- severe right-sided hydronephrosis    Survivorship Found to have hydronephrosis and masses in the pelvis very suspicious for metastatic disease.   Recommendation made for chemotherapy.   10/31/2013 PET scan Multiple hypermetabolic soft tissue lesions scattered through the lower mesentery and bilateral pelvis, consistent with metastatic disease.   11/26/2013 - 06/18/2014 Chemotherapy FOLFOX + Avastin by Dr. Jacquiline Doe at [place]   07/01/2014 - 07/15/2014 Radiation Therapy Dr. Tammi Klippel   11/13/2014 - 12/09/2014 Radiation Therapy 28 Gy in 10 fractions to residula pelvic lymph nodes by Dr. Tammi Klippel   08/04/2015 [facility] Admission [facility]   08/04/2015 Tumor Marker CEA: 28.4 (H)    08/04/2015 Imaging CT abd/pelvis- Distal small bowel obstruction at the level of the right pelvic sidewall tumor implant, which is decreased in size since 07/24/2014. 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    08/06/2015 Imaging CT chest- Scattered pulmonary nodules and distal periaortic adenopathy, new from 10/31/2013, indicative of metastatic disease.    Patient and plan discussed with Dr. Ancil Linsey and she is in agreement with the aforementioned.     LOS: 2 days    KEFALAS,THOMAS 08/06/2015 5:05 PM  As detailed.  Unfortunately, her surgical options are going to be limited. Retroperitoneal disease and new pulmonary mets make her no longer a candidate for HIPEC.  I still think she would benefit from a surgical referral for her "peace of mind " as well. She has had XRT to the pelvis which further complicates her surgical options. She did poorly with FOLFOX per records and her personal history but she certainly  has multiple other options for therapy we can discuss. I will address CT chest findings as outpatient. Donald Pore MD

## 2015-08-07 ENCOUNTER — Encounter (HOSPITAL_COMMUNITY): Payer: Self-pay | Admitting: Internal Medicine

## 2015-08-07 DIAGNOSIS — R112 Nausea with vomiting, unspecified: Secondary | ICD-10-CM

## 2015-08-07 DIAGNOSIS — R634 Abnormal weight loss: Secondary | ICD-10-CM

## 2015-08-07 MED ORDER — MORPHINE SULFATE (PF) 4 MG/ML IV SOLN
6.0000 mg | Freq: Once | INTRAVENOUS | Status: AC
  Administered 2015-08-07: 6 mg via SUBCUTANEOUS
  Filled 2015-08-07: qty 2

## 2015-08-07 MED ORDER — BOOST / RESOURCE BREEZE PO LIQD: 1.0000 | Freq: Three times a day (TID) | ORAL | Status: AC

## 2015-08-07 NOTE — Care Management Note (Signed)
Case Management Note  Patient Details  Name: Julie Galvan MRN: [id number] Date of Birth: February 09, 1951   Expected Discharge Date:                  Expected Discharge Plan:  Home/Self Care  In-House Referral:  NA  Discharge planning Services  CM Consult  Post Acute Care Choice:  NA Choice offered to:  NA  DME Arranged:    DME Agency:     HH Arranged:    HH Agency:     Status of Service:  Completed, signed off  Medicare Important Message Given:    Date Medicare IM Given:    Medicare IM give by:    Date Additional Medicare IM Given:    Additional Medicare Important Message give by:     If discussed at [place] of Stay Meetings, dates discussed:    Additional Comments: Pt discharging home today with self care. No CM needs.  Sherald Barge, RN 08/07/2015, 11:20 AM

## 2015-08-07 NOTE — Progress Notes (Signed)
Patient discharged home. Reviewed medications and instructions, verbalizes understanding.  Follow up in place with oncology, and message left to schedule appt at PCPs office.  Stable to DC at this time, assisted off unit via WC by NT.

## 2015-08-07 NOTE — Progress Notes (Signed)
  Subjective: Tolerating full liquid diet well. Has had multiple bowel movements. Minimal abdominal discomfort.  Objective: Vital signs in last 24 hours: Temp:  [98.1 F (36.7 C)-98.9 F (37.2 C)] 98.1 F (36.7 C) (02/16 0620) Pulse Rate:  [72-88] 88 (02/16 0620) Resp:  [18-20] 18 (02/16 0620) BP: (119-137)/(57-88) 119/57 mmHg (02/16 0620) SpO2:  [98 %-99 %] 98 % (02/16 0620) Weight:  [53.071 kg (117 lb)] 53.071 kg (117 lb) (02/16 0620) Last BM Date: 08/06/15  Intake/Output from previous day:   Intake/Output this shift:    General appearance: alert, cooperative and no distress GI: Soft. No rigidity noted. Mild nonspecific tenderness noted.  Lab Results:   Recent Labs  08/04/15 1455  WBC 6.3  HGB 14.0  HCT 43.7  PLT 273   BMET  Recent Labs  08/04/15 1455  NA 140  K 4.1  CL 102  CO2 26  GLUCOSE 91  BUN 10  CREATININE 0.83  CALCIUM 9.8   PT/INR No results for input(s): LABPROT, INR in the last 72 hours.  Studies/Results: Ct Chest Wo Contrast  08/06/2015  CLINICAL DATA:  Staging metastatic colon cancer, weight loss, vomiting and diarrhea. EXAM: CT CHEST WITHOUT CONTRAST TECHNIQUE: Multidetector CT imaging of the chest was performed following the standard protocol without IV contrast. COMPARISON:  CT abdomen pelvis 08/04/2015 and PET 10/31/2013. FINDINGS: Mediastinum/Nodes: Left subclavian Port-A-Cath terminates in the SVC. No pathologically enlarged mediastinal or axillary lymph nodes. Hilar regions are difficult to definitively evaluate without IV contrast. Heart size normal. Left anterior descending coronary artery calcification. No pericardial effusion. Lymph node adjacent to the lower descending thoracic aorta measures 9 mm. Lungs/Pleura: Scattered pulmonary nodules appear new from 10/31/2013. Index nodule in the subpleural right lower lobe measures 7 x 12 mm (series 3, image 40). No pleural fluid. Airway is unremarkable. Upper abdomen: Hepatic and left renal  lesions, as well as retroperitoneal adenopathy, better seen on diagnostic examination of 08/04/2015. Musculoskeletal: Scattered sclerotic lesions in the spine appear to have been present on 10/31/2013. Degenerative changes are seen in the spine as well. IMPRESSION: 1. Scattered pulmonary nodules and distal periaortic adenopathy, new from 10/31/2013, indicative of metastatic disease. 2. Retroperitoneal adenopathy better seen on 08/04/2015. 3. Left anterior descending coronary artery calcification. Electronically Signed   By: Lorin Picket M.D.   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 Tolerating full liquid diet well. Plan: Please see oncology note. We'll advance to soft diet. Will follow peripherally with you. Okay for discharge from surgery standpoint.  LOS: 3 days    Julie Galvan A 08/07/2015

## 2015-08-07 NOTE — Discharge Summary (Signed)
Physician Discharge Summary  Julie Galvan [id number] DOB: 27-Apr-1951 DOA: 08/04/2015  PCP: [name], MD  Admit date: 08/04/2015 Discharge date: 08/07/2015  Time spent: 35 minutes  Recommendations for Outpatient Follow-up:  1. Follow up with PCP in one week.  2. Follow up with Dr Whitney Muse next week.    Discharge Diagnoses:  Principal Problem:   Abdominal pain Active Problems:   Colon cancer metastasized to multiple sites [facility])   SBO (small bowel obstruction) (HCC)   Volume depletion   Weight loss   Nausea & vomiting   Colitis   Colo-enteric fistula   Peritoneal carcinomatosis (HCC)   Malnutrition of moderate degree   Discharge Condition: improved.  Able to eat.   Diet recommendation: soft bland diet.   Filed Weights   08/05/15 0436 08/06/15 0436 08/07/15 0620  Weight: 51.166 kg (112 lb 12.8 oz) 52.39 kg (115 lb 8 oz) 53.071 kg (117 lb)    History of present illness:  Patient was admitted into the hospital for SBO by Dr Jonnie Finner on Aug 04, 2015.  As per his H and P:  " Julie Galvan is a 65 y.o. female with hx of of HTN and colon cancer dx'd in Jan 2013. She was treated with partial colectomy. Later she had chemoRx and radiation but per patient she didn't "do too well' with the chemoRx and had to stop it. She had some radiation last year as well. Now she presents with anorexia, 20 lb wt loss over last couple of months, nausea/ vomiting and severe abd pain worsening over the past week or so. CT abdomen in the ED suggests SBO, possible enteric fistula, numerous tumor deposits/ LAN, and a focal area of possible colitis in sigmoid region. Having some chills but no fevers or sweats. WBC here is normal, Hb 14, alb 3.9. Asked to admit for SBO prob related to metastatic colon cancer intra-abdominal disease. .  Patient says she likes garden, do chores, and work. Denies any CP, heart problems. She has a R nephrostomy tube placed at [facility] and changed every 6 weeks, this was done  after an internal stent failed. She denies any dysuria, gross hematuria, joitn pain, rash, HA or blurred vision. She is very thirsty w dry mouth.    Hospital Course: Patient was seen by surgery, given IVF and  IV pain meds.  She had bowel rest, and her SBO resolved.  It was felt to be due to carcinomatosis.  She also was seen by oncology, and a CT of her chest also showed metastatic disease as well.  She was able to have her diet advanced, and will follow up with oncology for any further Tx option next week.  During this hospitalization, TF, PPN, TPN were discussed, as she may not be able to keep up with her nutritional needs, and she doesn't want any of these offers.  We also discussed code status, and after taking time to ponder, she confirmed that she would like to be a DNR.  We will honor her wishes.  Surgery felt she can be discharged home, and she is stable for discharge.  Will have her follow up with her PCP next week and follow up with oncology as well.  Thank you for allowing me to participate in her care.  It was an Surveyor, minerals.   Good Day.    Consultations:  General surgery:  Dr Arnoldo Morale.  Oncology:  Dr Whitney Muse.   Discharge Exam: Filed Vitals:   08/07/15 [id number] 08/07/15 0825  BP: 119/57 122/64  Pulse: 88 84  Temp: 98.1 F (36.7 C) 97.7 F (36.5 C)  Resp: 18 18    Discharge Instructions   Discharge Instructions    Diet - low sodium heart healthy    Complete by:  As directed      Discharge instructions    Complete by:  As directed   Progress eating slowly.  Please follow up with your oncologist.     Increase activity slowly    Complete by:  As directed           Current Discharge Medication List    START taking these medications   Details  feeding supplement (BOOST / RESOURCE BREEZE) LIQD Take 1 Container by mouth 3 (three) times daily between meals. Qty: 10 Container, Refills: 10      CONTINUE these medications which have NOT CHANGED   Details  Frankincense OIL Take  1 capsule by mouth every 3 (three) days.     metoprolol tartrate (LOPRESSOR) 25 MG tablet Take 25 mg by mouth 2 (two) times daily.    oxyCODONE (OXY IR/ROXICODONE) 5 MG immediate release tablet Take 5 mg by mouth every 6 (six) hours as needed for moderate pain or severe pain.    ranitidine (ZANTAC) 150 MG tablet Take 150 mg by mouth 2 (two) times daily.      STOP taking these medications     Calcium Carbonate-Vit D-Min (CALTRATE 600+D PLUS MINERALS) 600-800 MG-UNIT CHEW        Allergies  Allergen Reactions  . Sulfa Antibiotics Hives and Other (See Comments)    Headache       The results of significant diagnostics from this hospitalization (including imaging, microbiology, ancillary and laboratory) are listed below for reference.    Significant Diagnostic Studies: Ct Chest Wo Contrast  08/06/2015  CLINICAL DATA:  Staging metastatic colon cancer, weight loss, vomiting and diarrhea. EXAM: CT CHEST WITHOUT CONTRAST TECHNIQUE: Multidetector CT imaging of the chest was performed following the standard protocol without IV contrast. COMPARISON:  CT abdomen pelvis 08/04/2015 and PET 10/31/2013. FINDINGS: Mediastinum/Nodes: Left subclavian Port-A-Cath terminates in the SVC. No pathologically enlarged mediastinal or axillary lymph nodes. Hilar regions are difficult to definitively evaluate without IV contrast. Heart size normal. Left anterior descending coronary artery calcification. No pericardial effusion. Lymph node adjacent to the lower descending thoracic aorta measures 9 mm. Lungs/Pleura: Scattered pulmonary nodules appear new from 10/31/2013. Index nodule in the subpleural right lower lobe measures 7 x 12 mm (series 3, image 40). No pleural fluid. Airway is unremarkable. Upper abdomen: Hepatic and left renal lesions, as well as retroperitoneal adenopathy, better seen on diagnostic examination of 08/04/2015. Musculoskeletal: Scattered sclerotic lesions in the spine appear to have been present  on 10/31/2013. Degenerative changes are seen in the spine as well. IMPRESSION: 1. Scattered pulmonary nodules and distal periaortic adenopathy, new from 10/31/2013, indicative of metastatic disease. 2. Retroperitoneal adenopathy better seen on 08/04/2015. 3. Left anterior descending coronary artery calcification. Electronically Signed   By: Lorin Picket M.D.   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 EXAM: CT ABDOMEN AND PELVIS WITH CONTRAST TECHNIQUE: Multidetector CT imaging of the abdomen and pelvis was performed using the standard protocol following bolus administration of intravenous contrast. CONTRAST:  174mL OMNIPAQUE IOHEXOL 300 MG/ML  SOLN COMPARISON:  07/24/2014 CT abdomen/pelvis. FINDINGS: Lower  chest: No significant pulmonary nodules or acute consolidative airspace disease. Hepatobiliary: Stable 3.4 cm segment 4A, 1.2 cm segment 4B and 0.9 cm segment 6 liver hemangiomas, all unchanged since 07/05/2011. No new liver masses. Stable focal adenomyomatosis in the gallbladder fundus. Stable 0.5 cm dense focus in the dependent gallbladder wall, either a polyp or adherent stone. Otherwise no gallbladder wall thickening or pericholecystic fluid. No biliary ductal dilatation. Pancreas: Normal, with no mass or duct dilation. Spleen: Normal size. No mass. Adrenals/Urinary Tract: Normal adrenals. No hydronephrosis. Right percutaneous nephrostomy tube is stable in position with the distal pigtail within the right renal pelvis. Expected small amount of gas in the right renal collecting system from instrumentation. Stable asymmetric atrophy of the right kidney. Simple 1.5 cm renal cyst in the posterior upper left kidney. There is dilatation of the lumbar segment of the right ureter. Normal bladder. Stomach/Bowel: Grossly normal  stomach. There is moderate dilatation of the proximal and mid small bowel to the level of the right pelvic small bowel, with a focal small bowel caliber transition in the right pelvis (series 2/ image 45 and series 3/ image 42), which is adjacent to the 3.0 x 1.8 cm right pelvic sidewall heterogeneous mass (series 2/ image 51), which is decreased in size from 6.9 x 5.9 cm on 07/24/2014. 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 Stable postsurgical changes in the right abdomen status post ileocecal resection with stable appearance of the neo ileocolic anastomosis. Circumferential wall thickening in the mid to distal sigmoid colon is nonspecific and most suggestive of colitis. Vascular/Lymphatic: Atherosclerotic nonaneurysmal abdominal aorta. Patent portal, splenic, hepatic and renal veins. There is new mild-to-moderate aortocaval and right common iliac lymphadenopathy, for example a 1.5 cm aortocaval node (series 2/ image 25) and a 1.3 cm right common iliac node (series 2/ image 37). Reproductive: Status post hysterectomy, with no new abnormality at the vaginal cuff. Other: No pneumoperitoneum. Left deep pelvic heterogeneous 3.0 x 2.8 cm mass (series 2/ image 57), previously 3.9 x 2.5 cm, not definitely changed. Left anterior peritoneal 1.6 cm tumor implant adjacent to the left rectus muscle sheath (series 2/image 45), increased from 0.7 cm. Anterior right lower quadrant 2.3 x 2.1 cm peritoneal tumor implant (series 2/image 42), previously 1.0 x 1.0 cm, increased. Musculoskeletal: No aggressive appearing focal osseous lesions. Mild degenerative changes in the visualized thoracolumbar spine. IMPRESSION: 1. Distal small bowel obstruction at the level of the right pelvic sidewall tumor implant, which is decreased in size since 07/24/2014. Probable new colo-enteric fistula between the distal sigmoid colon and a right pelvic  small bowel loop. Wall thickening in the mid to distal sigmoid colon suggests a nonspecific sigmoid colitis. 2. New retroperitoneal and right common iliac lymphadenopathy, probably metastatic . 3. Interval growth of peritoneal tumor implants in the anterior right lower quadrant and abutting the left rectus muscle sheath. Stable left pelvic side wall peritoneal tumor implant. 4. No hydronephrosis. Stable well-positioned right percutaneous nephrostomy tube. Stable asymmetric atrophy of the right kidney. Electronically Signed   By: Ilona Sorrel M.D.   On: 08/04/2015 17:58    Microbiology: Recent Results (from the past 240 hour(s))  Urine culture     Status: None   Collection Time: 08/04/15  2:20 PM  Result Value Ref Range Status   Specimen Description URINE, CLEAN CATCH  Final   Special Requests NONE  Final   Culture   Final  MULTIPLE SPECIES PRESENT, SUGGEST RECOLLECTION Performed at [facility]    Report Status 08/06/2015 FINAL  Final     Labs: Basic Metabolic Panel:  Recent Labs Lab 08/04/15 1455  NA 140  K 4.1  CL 102  CO2 26  GLUCOSE 91  BUN 10  CREATININE 0.83  CALCIUM 9.8   Liver Function Tests:  Recent Labs Lab 08/04/15 1455  AST 11*  ALT 10*  ALKPHOS 64  BILITOT 1.2  PROT 7.8  ALBUMIN 3.9    Recent Labs Lab 08/04/15 1455  LIPASE 23   No results for input(s): AMMONIA in the last 168 hours. CBC:  Recent Labs Lab 08/04/15 1455  WBC 6.3  HGB 14.0  HCT 43.7  MCV 85.2  PLT 273    Signed:  Daveyon Kitchings MD.  [facility] 08/07/2015, 10:50 AM

## 2015-08-14 ENCOUNTER — Other Ambulatory Visit: Payer: Self-pay | Admitting: Surgery

## 2015-08-14 ENCOUNTER — Encounter (HOSPITAL_COMMUNITY): Payer: BLUE CROSS/BLUE SHIELD | Attending: Hematology & Oncology | Admitting: Hematology & Oncology

## 2015-08-14 ENCOUNTER — Other Ambulatory Visit (HOSPITAL_COMMUNITY): Payer: Self-pay | Admitting: *Deleted

## 2015-08-14 ENCOUNTER — Ambulatory Visit (HOSPITAL_COMMUNITY)
Admission: RE | Admit: 2015-08-14 | Discharge: 2015-08-14 | Disposition: A | Discharge status: Home / Self Care | Payer: BLUE CROSS/BLUE SHIELD | Source: Ambulatory Visit | Attending: Hematology & Oncology | Admitting: Hematology & Oncology

## 2015-08-14 ENCOUNTER — Other Ambulatory Visit (HOSPITAL_COMMUNITY): Payer: Self-pay | Admitting: Hematology & Oncology

## 2015-08-14 ENCOUNTER — Encounter (HOSPITAL_COMMUNITY): Payer: Self-pay | Admitting: Hematology & Oncology

## 2015-08-14 VITALS — BP 106/42 | HR 77 | Temp 98.7°F | Resp 18 | Wt 117.2 lb

## 2015-08-14 DIAGNOSIS — M7989 Other specified soft tissue disorders: Secondary | ICD-10-CM | POA: Diagnosis not present

## 2015-08-14 DIAGNOSIS — M79662 Pain in left lower leg: Secondary | ICD-10-CM

## 2015-08-14 DIAGNOSIS — C786 Secondary malignant neoplasm of retroperitoneum and peritoneum: Secondary | ICD-10-CM

## 2015-08-14 DIAGNOSIS — R109 Unspecified abdominal pain: Secondary | ICD-10-CM | POA: Diagnosis not present

## 2015-08-14 DIAGNOSIS — M79661 Pain in right lower leg: Secondary | ICD-10-CM

## 2015-08-14 DIAGNOSIS — R911 Solitary pulmonary nodule: Secondary | ICD-10-CM | POA: Diagnosis not present

## 2015-08-14 DIAGNOSIS — C189 Malignant neoplasm of colon, unspecified: Secondary | ICD-10-CM | POA: Insufficient documentation

## 2015-08-14 DIAGNOSIS — R609 Edema, unspecified: Secondary | ICD-10-CM

## 2015-08-14 DIAGNOSIS — N135 Crossing vessel and stricture of ureter without hydronephrosis: Secondary | ICD-10-CM

## 2015-08-14 NOTE — Progress Notes (Signed)
Julie Ponto, MD [address] 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  SUMMARY OF ONCOLOGIC HISTORY:   Colon cancer metastasized to multiple sites [facility])   08/18/2011 Initial Diagnosis Status post appendectomy.  Cancer found within specimen.  Underwent followup open right proximal colectomy for per 2013.  T3 N0.  0/16 lymph nodes positive.  Declined followup.   10/18/2013 Imaging CT abd/pelvis- severe right-sided hydronephrosis    Survivorship Found to have hydronephrosis and masses in the pelvis very suspicious for metastatic disease.  Recommendation made for chemotherapy.   10/31/2013 PET scan Multiple hypermetabolic soft tissue lesions scattered through the lower mesentery and bilateral pelvis, consistent with metastatic disease.   11/26/2013 - 06/18/2014 Chemotherapy FOLFOX + Avastin by Dr. Jacquiline Doe at [facility]   07/01/2014 - 07/15/2014 Radiation Therapy Dr. Tammi Klippel   11/13/2014 - 12/09/2014 Radiation Therapy 28 Gy in 10 fractions to residula pelvic lymph nodes by Dr. Tammi Klippel   08/04/2015 [facility] Admission SBO   08/04/2015 Tumor Marker CEA: 28.4 (H)    08/04/2015 Imaging CT abd/pelvis- Distal small bowel obstruction at the level of the right pelvic sidewall tumor implant, which is decreased in size since 07/24/2014. 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    08/06/2015 Imaging CT chest- Scattered pulmonary nodules and distal periaortic adenopathy, new from 10/31/2013, indicative of metastatic disease.    CURRENT THERAPY: Observation  INTERVAL HISTORY: Julie Galvan 65 y.o. female returns for Stage IV adenocarcinoma of colon.   Julie Galvan is a 65  yo white American female with a past medical history significant for Stage IV well-differentiated adenocarcinoma of colon, KRAS-positive, (with documentation of appendiceal carcinoma from outside Medical oncology dictations) initially presenting with Stage II disease managed surgically and then found to have with bulky pelvic disease with a history of severe right hydronephrosis secondary to a right pelvic mass S/P FOLFOX + Avastin by Dr. Jacquiline Doe last treated in December 2015 and discontinued due to intolerance. Since then, she has undergone palliative radiation by Dr. Tammi Klippel. EXACT DETAILS ARE UNKNOWN.  According to the patient, she underwent an appendectomy in 2013 and at that time, she reports that her surgeon was suspicious for malignancy given inspection of the removed appendix. She notes that pathology was positive for disease which lead to a colonoscopy and further colon resection for adenocarcinoma. She notes that 5 lymph nodes were removed at time of this surgery and all were negative for metastatic disease. No further therapy was performed at that time according to the patient. She notes that in 2015, her primary care provider was suspicious about a finding on physical exam which lead to an [place] of pelvis. This was subsequently followed by a CT abd/pelvis and then a PET scan. PET scan in May 2015 demonstrated metastatic disease with multiple soft tissue lesions that were hypermetabolic through the lower mesentery and bilateral pelvis. She notes that she was subsequently treated with chemotherapy. She was treated with systemic chemotherapy consisting of FOLFOX + Avastin. She reports that she was nauseated and suffered with severe fatigue and tiredness. As a result, she stopped therapy and was last treated in December of 2015. She was then followed by Dr. Jacquiline Doe for a short  time and then failed to continue to follow with him with last appointment documented in [place] was on 10/04/2014  with a requested 3 months follow-up appointment according to his dictation, but no more recent documentation. She has been treated with palliative radiation by Dr. Tammi Klippel, most recent treatment to residual pelvic lymph nodes in 10 fractions.  She has a nephrostomy tube that has been unable to be "internalized", she notes her urology care has been at [facility] in [place].   Julie Galvan is accompanied by her husband and sister. I personally reviewed and went over imaging studies and treatment options at length with the patient, including newly discovered pulmonary nodules.  She notes significant LLE swelling that has developed since her discharge from [facility].  Reports she can feel the metastases as her abdomen often feels very heavy.   She is no longer obstructed. She has been eating without vomiting. She is able to eat solid foods, and also drinks Ensure.   She continues to experience abdominal pain, necessitating daily pain medication use. In the mornings when she first urinates, she begins to feel pain however she tries not to take her pain medication immediately and tries to hold off as long as possible. Although, she needs to take some to make it through the day. Her husband states that she takes about two pills of pain medication per day. She denies constipation.  The patient returns today for further evaluation of her metastatic colon cancer and treatment options moving forward.   MEDICAL HISTORY: Past Medical History  Diagnosis Date  . Hypertension   . Heart palpitations     intermittent" especially if forgets to take Metoprolol."  . Anxiety   . H/O seasonal allergies 11-20-13    "causes coughing"  . Chronic kidney disease     hx. of kidney stones and right ureteral stent placed due to mestatic cancer  . GERD (gastroesophageal reflux disease)   . Arthritis     fingers  . Colon cancer metastasized to multiple sites [facility])     Cecum [id number]  . Peritoneal carcinomatosis [facility])     has  Colon cancer metastasized to multiple sites [facility]); SBO (small bowel obstruction) ([place]); Volume depletion; Weight loss; Nausea & vomiting; Abdominal pain; Colitis; Colo-enteric fistula; Peritoneal carcinomatosis ([place]); and Malnutrition of moderate degree on her problem list.     is allergic to sulfa antibiotics.  @MEDADMINPROSE @  SURGICAL HISTORY: Past Surgical History  Procedure Laterality Date  . Ureteral stent placement Right 11-21-13    done at [facility]  . Appendectomy  Jan 2013    1'13 dx. cancer  . Colon surgery Right 08/18/2011    colon resection for cancer and agin for obstruction  . Abdominal hysterectomy    . Breast surgery      left breast lumpectomy-benign-40 yrs ago  . Portacath placement N/A 11/22/2013    Procedure: INSERTION PORT-A-CATH LEFT SUBCLAVIAN VEIN;  Surgeon: Shann Medal, MD;  Location: [facility];  Service: General;  Laterality: N/A;    SOCIAL HISTORY: Social History   Social History  . Marital Status: Married    Spouse Name: N/A  . Number of Children: N/A  . Years of Education: N/A   Occupational History  . Not on file.   Social History Main Topics  . Smoking status: Former Smoker    Types: Cigarettes    Quit date: 09/21/2013  . Smokeless tobacco: Not on file  . Alcohol Use: No  . Drug Use: No  . Sexual Activity:  Not on file   Other Topics Concern  . Not on file   Social History Narrative    FAMILY HISTORY: Family History  Problem Relation Age of Onset  . Cancer Mother   . Stroke Father     Review of Systems  Constitutional: Negative.  Negative for fever, chills, weight loss and malaise/fatigue.  HENT: Negative.  Negative for congestion, hearing loss, nosebleeds, sore throat and tinnitus.   Eyes: Negative.  Negative for blurred vision, double vision, pain and discharge.  Respiratory: Negative.  Negative for cough, hemoptysis, sputum production, shortness of breath and wheezing.   Cardiovascular: Positive for leg swelling. Negative  for chest pain, palpitations, claudication and PND.       Right leg swelling.  Gastrointestinal: Positive for abdominal pain. Negative for heartburn, nausea, vomiting, diarrhea, constipation, blood in stool and melena.       Abdominal pain managed with pain medication.  Genitourinary: Negative.  Negative for dysuria, urgency, frequency and hematuria.  Musculoskeletal: Negative.  Negative for myalgias, joint pain and falls.  Skin: Negative.  Negative for itching and rash.  Neurological: Negative.  Negative for dizziness, tingling, tremors, sensory change, speech change, focal weakness, seizures, loss of consciousness, weakness and headaches.  Endo/Heme/Allergies: Negative.  Does not bruise/bleed easily.  Psychiatric/Behavioral: Negative.  Negative for depression, suicidal ideas, memory loss and substance abuse. The patient is not nervous/anxious and does not have insomnia.   All other systems reviewed and are negative.   PHYSICAL EXAMINATION  ECOG PERFORMANCE STATUS: 1 - Symptomatic but completely ambulatory  Filed Vitals:   08/14/15 0814  BP: 106/42  Pulse: 77  Temp: 98.7 F (37.1 C)  Resp: 18    Physical Exam  Constitutional: She is oriented to person, place, and time and well-developed, well-nourished, and in no distress.  HENT:  Head: Normocephalic and atraumatic.  Nose: Nose normal.  Mouth/Throat: Oropharynx is clear and moist. No oropharyngeal exudate.  Eyes: Conjunctivae and EOM are normal. Pupils are equal, round, and reactive to light. Right eye exhibits no discharge. Left eye exhibits no discharge. No scleral icterus.  Neck: Normal range of motion. Neck supple. No tracheal deviation present. No thyromegaly present.  Cardiovascular: Normal rate, regular rhythm and normal heart sounds.  Exam reveals no gallop and no friction rub.   No murmur heard. Pulmonary/Chest: Effort normal and breath sounds normal. She has no wheezes. She has no rales.  Abdominal: Soft. Bowel sounds  are normal. She exhibits no distension and no mass. There is tenderness. There is no rebound and no guarding.  Musculoskeletal: Normal range of motion. She exhibits no edema.  Left leg about 3x size of the left.  Lymphadenopathy:    She has no cervical adenopathy.  Neurological: She is alert and oriented to person, place, and time. She has normal reflexes. No cranial nerve deficit. Gait normal. Coordination normal.  Skin: Skin is warm and dry. No rash noted.  Psychiatric: Mood, memory, affect and judgment normal.  Nursing note and vitals reviewed.   LABORATORY DATA:  I have reviewed the data as listed.  CBC    Component Value Date/Time   WBC 6.3 08/04/2015 1455   RBC 5.13* 08/04/2015 1455   HGB 14.0 08/04/2015 1455   HCT 43.7 08/04/2015 1455   PLT 273 08/04/2015 1455   MCV 85.2 08/04/2015 1455   MCH 27.3 08/04/2015 1455   MCHC 32.0 08/04/2015 1455   RDW 13.2 08/04/2015 1455    CMP     Component Value Date/Time  NA 140 08/04/2015 1455   K 4.1 08/04/2015 1455   CL 102 08/04/2015 1455   CO2 26 08/04/2015 1455   GLUCOSE 91 08/04/2015 1455   BUN 10 08/04/2015 1455   CREATININE 0.83 08/04/2015 1455   CALCIUM 9.8 08/04/2015 1455   PROT 7.8 08/04/2015 1455   ALBUMIN 3.9 08/04/2015 1455   AST 11* 08/04/2015 1455   ALT 10* 08/04/2015 1455   ALKPHOS 64 08/04/2015 1455   BILITOT 1.2 08/04/2015 1455   GFRNONAA >60 08/04/2015 1455   GFRAA >60 08/04/2015 1455   Results for Julie Galvan, Julie Galvan (MRN [id number])   Ref. Range 08/04/2015 14:55  CEA Latest Ref Range: 0.0-4.7 ng/mL 28.4 (H)      RADIOGRAPHIC STUDIES: I have personally reviewed the radiological images as listed and agreed with the findings in the report.  Study Result     CLINICAL DATA: Staging metastatic colon cancer, weight loss, vomiting and diarrhea.  EXAM: CT CHEST WITHOUT CONTRAST  TECHNIQUE: Multidetector CT imaging of the chest was performed following the standard protocol without IV  contrast.  COMPARISON: CT abdomen pelvis 08/04/2015 and PET 10/31/2013.  FINDINGS: Mediastinum/Nodes: Left subclavian Port-A-Cath terminates in the SVC. No pathologically enlarged mediastinal or axillary lymph nodes. Hilar regions are difficult to definitively evaluate without IV contrast. Heart size normal. Left anterior descending coronary artery calcification. No pericardial effusion. Lymph node adjacent to the lower descending thoracic aorta measures 9 mm.  Lungs/Pleura: Scattered pulmonary nodules appear new from 10/31/2013. Index nodule in the subpleural right lower lobe measures 7 x 12 mm (series 3, image 40). No pleural fluid. Airway is unremarkable.  Upper abdomen: Hepatic and left renal lesions, as well as retroperitoneal adenopathy, better seen on diagnostic examination of 08/04/2015.  Musculoskeletal: Scattered sclerotic lesions in the spine appear to have been present on 10/31/2013. Degenerative changes are seen in the spine as well.  IMPRESSION: 1. Scattered pulmonary nodules and distal periaortic adenopathy, new from 10/31/2013, indicative of metastatic disease. 2. Retroperitoneal adenopathy better seen on 08/04/2015. 3. Left anterior descending coronary artery calcification.   Electronically Signed  By: Lorin Picket M.D.  On: 08/06/2015 09:23    Study Result      CLINICAL DATA: 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  EXAM: CT ABDOMEN AND PELVIS WITH CONTRAST  TECHNIQUE: Multidetector CT imaging of the abdomen and pelvis was performed using the standard protocol following bolus administration of intravenous contrast.  CONTRAST: 165m OMNIPAQUE IOHEXOL 300 MG/ML SOLN  COMPARISON: 07/24/2014 CT abdomen/pelvis.  FINDINGS: Lower chest: No significant pulmonary nodules or acute consolidative airspace  disease.  Hepatobiliary: Stable 3.4 cm segment 4A, 1.2 cm segment 4B and 0.9 cm segment 6 liver hemangiomas, all unchanged since 07/05/2011. No new liver masses. Stable focal adenomyomatosis in the gallbladder fundus. Stable 0.5 cm dense focus in the dependent gallbladder wall, either a polyp or adherent stone. Otherwise no gallbladder wall thickening or pericholecystic fluid. No biliary ductal dilatation.  Pancreas: Normal, with no mass or duct dilation.  Spleen: Normal size. No mass.  Adrenals/Urinary Tract: Normal adrenals. No hydronephrosis. Right percutaneous nephrostomy tube is stable in position with the distal pigtail within the right renal pelvis. Expected small amount of gas in the right renal collecting system from instrumentation. Stable asymmetric atrophy of the right kidney. Simple 1.5 cm renal cyst in the posterior upper left kidney. There is dilatation of the lumbar segment of the right ureter. Normal bladder.  Stomach/Bowel: Grossly normal  stomach. There is moderate dilatation of the proximal and mid small bowel to the level of the right pelvic small bowel, with a focal small bowel caliber transition in the right pelvis (series 2/ image 45 and series 3/ image 42), which is adjacent to the 3.0 x 1.8 cm right pelvic sidewall heterogeneous mass (series 2/ image 51), which is decreased in size from 6.9 x 5.9 cm on 07/24/2014. 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 Stable postsurgical changes in the right abdomen status post ileocecal resection with stable appearance of the neo ileocolic anastomosis. Circumferential wall thickening in the mid to distal sigmoid colon is nonspecific and most suggestive of colitis.  Vascular/Lymphatic: Atherosclerotic nonaneurysmal abdominal aorta. Patent portal, splenic, hepatic and renal veins. There is new mild-to-moderate aortocaval  and right common iliac lymphadenopathy, for example a 1.5 cm aortocaval node (series 2/ image 25) and a 1.3 cm right common iliac node (series 2/ image 37).  Reproductive: Status post hysterectomy, with no new abnormality at the vaginal cuff.  Other: No pneumoperitoneum. Left deep pelvic heterogeneous 3.0 x 2.8 cm mass (series 2/ image 57), previously 3.9 x 2.5 cm, not definitely changed. Left anterior peritoneal 1.6 cm tumor implant adjacent to the left rectus muscle sheath (series 2/image 45), increased from 0.7 cm. Anterior right lower quadrant 2.3 x 2.1 cm peritoneal tumor implant (series 2/image 42), previously 1.0 x 1.0 cm, increased.  Musculoskeletal: No aggressive appearing focal osseous lesions. Mild degenerative changes in the visualized thoracolumbar spine.  IMPRESSION: 1. Distal small bowel obstruction at the level of the right pelvic sidewall tumor implant, which is decreased in size since 07/24/2014. 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 Wall thickening in the mid to distal sigmoid colon suggests a nonspecific sigmoid colitis. 2. New retroperitoneal and right common iliac lymphadenopathy, probably metastatic . 3. Interval growth of peritoneal tumor implants in the anterior right lower quadrant and abutting the left rectus muscle sheath. Stable left pelvic side wall peritoneal tumor implant. 4. No hydronephrosis. Stable well-positioned right percutaneous nephrostomy tube. Stable asymmetric atrophy of the right kidney.   Electronically Signed  By: Ilona Sorrel M.D.  On: 08/04/2015 17:58   ASSESSMENT and THERAPY PLAN:  STAGE IV CRC Peritoneal Carcinomatosis Pulmonary Nodules SBO --RESOLVED LLE swelling R nephrostomy tube, managed at [facility] in [place] last exchanged on 06/17/2015 Prior therapy with FOLFOX/AVASTIN   I personally reviewed and went over imaging studies and treatment options  at length with the patient, including newly discovered pulmonary nodules, immunotherapy, tumor marker testing, and chemotherapy. The patient was given an informational booklet about colon cancer today.  The patient will sign a release form for additional documentation from [facility] not available in [place].  I have referred the patient to [facility] for consultation. I doubt at this point if she is a candidate for HIPEC but I feel consultation is necessary for the patient and family.. The patient previously underwent palliative radiation with Dr. Tammi Klippel.  We will perform KRAS status and MSI testing.  I have ordered an ultrasound of her left leg to evaluate her current swelling.  The patient will meet with Hildred Alamin, our patient navigator, today.  She will return next week for follow up.  All questions were answered. The patient knows to call the clinic with any problems, questions or concerns. We can certainly see the  patient much sooner if necessary. This document serves as a record of services personally performed by Ancil Linsey, MD. It was created on her behalf by Kandace Blitz, a trained medical scribe. The creation of this record is based on the scribe's personal observations and the provider's statements to them. This document has been checked and approved by the attending provider. I have reviewed the above documentation for accuracy and completeness, and I agree with the above. This note was electronically signed. Molli Hazard, MD  08/14/2015

## 2015-08-14 NOTE — Patient Instructions (Addendum)
Julie Galvan at [facility] Discharge Instructions  RECOMMENDATIONS MADE BY THE CONSULTANT AND ANY TEST RESULTS WILL BE SENT TO YOUR REFERRING PHYSICIAN.   Exam and discussion by Dr Julie Galvan today So when we got the scans it showed that you had pulmonary nodules. That means that the colon cancer has spread to the lungs. Dr Johney Maine is at [facility] in [place], to see if we need to re-sect that area before we start chemotherapy. There is a drug called Beryle Flock that we can give, we have to run a specific marker to see if you tumor is a recipient to receive this drug. Our goal is to help you , not to set you back. We are here to treat you, not make you sicker.   We are going to try to get your dye study from [facility]  Ultrasound of your right leg  Julie Galvan is our nurse navigator.  She is a good resource. If you have any questions please call her.  Return to see the doctor in one week  Please call the clinic if you have any questions or concerns    Thank you for choosing [place] at [facility] to provide your oncology and hematology care.  To afford each patient quality time with our provider, please arrive at least 15 minutes before your scheduled appointment time.   Beginning January 23rd 2017 lab work for the [name] will be done in the  Main lab at [facility] on 1st floor. If you have a lab appointment with the [facility] please come in thru the  Main Entrance and check in at the main information desk  You need to re-schedule your appointment should you arrive 10 or more minutes late.  We strive to give you quality time with our providers, and arriving late affects you and other patients whose appointments are after yours.  Also, if you no show three or more times for appointments you may be dismissed from the clinic at the providers discretion.     Again, thank you for choosing [facility].  Our hope is that these  requests will decrease the amount of time that you wait before being seen by our physicians.       _____________________________________________________________  Should you have questions after your visit to [facility], please contact our office at [phone] between the hours of 8:30 a.m. and 4:30 p.m.  Voicemails left after 4:30 p.m. will not be returned until the following business day.  For prescription refill requests, have your pharmacy contact our office.           Pembrolizumab injection What is this medicine? PEMBROLIZUMAB (pem broe liz ue mab) is a monoclonal antibody. It is used to treat melanoma and non-small cell lung cancer. This medicine may be used for other purposes; ask your health care provider or pharmacist if you have questions. What should I tell my health care provider before I take this medicine? They need to know if you have any of these conditions: -diabetes -immune system problems -inflammatory bowel disease -liver disease -lung or breathing disease -lupus -an unusual or allergic reaction to pembrolizumab, other medicines, foods, dyes, or preservatives -pregnant or trying to get pregnant -breast-feeding How should I use this medicine? This medicine is for infusion into a vein. It is given by a health care professional in a hospital or clinic setting. A special MedGuide will be given to you before each  treatment. Be sure to read this information carefully each time. Talk to your pediatrician regarding the use of this medicine in children. Special care may be needed. Overdosage: If you think you have taken too much of this medicine contact a poison control center or emergency room at once. NOTE: This medicine is only for you. Do not share this medicine with others. What if I miss a dose? It is important not to miss your dose. Call your doctor or health care professional if you are unable to keep an appointment. What may interact with this  medicine? Interactions have not been studied. Give your health care provider a list of all the medicines, herbs, non-prescription drugs, or dietary supplements you use. Also tell them if you smoke, drink alcohol, or use illegal drugs. Some items may interact with your medicine. This list may not describe all possible interactions. Give your health care provider a list of all the medicines, herbs, non-prescription drugs, or dietary supplements you use. Also tell them if you smoke, drink alcohol, or use illegal drugs. Some items may interact with your medicine. What should I watch for while using this medicine? Your condition will be monitored carefully while you are receiving this medicine. You may need blood work done while you are taking this medicine. Do not become pregnant while taking this medicine or for 4 months after stopping it. Women should inform their doctor if they wish to become pregnant or think they might be pregnant. There is a potential for serious side effects to an unborn child. Talk to your health care professional or pharmacist for more information. Do not breast-feed an infant while taking this medicine or for 4 months after the last dose. What side effects may I notice from receiving this medicine? Side effects that you should report to your doctor or health care professional as soon as possible: -allergic reactions like skin rash, itching or hives, swelling of the face, lips, or tongue -bloody or black, tarry stools -breathing problems -change in the amount of urine -changes in vision -chest pain -chills -dark urine -dizziness or feeling faint or lightheaded -fast or irregular heartbeat -fever -flushing -hair loss -muscle pain -muscle weakness -persistent headache -signs and symptoms of high blood sugar such as dizziness; dry mouth; dry skin; fruity breath; nausea; stomach pain; increased hunger or thirst; increased urination -signs and symptoms of liver injury like  dark urine, light-colored stools, loss of appetite, nausea, right upper belly pain, yellowing of the eyes or skin -stomach pain -weight loss Side effects that usually do not require medical attention (Report these to your doctor or health care professional if they continue or are bothersome.):constipation -cough -diarrhea -joint pain -tiredness This list may not describe all possible side effects. Call your doctor for medical advice about side effects. You may report side effects to FDA at 1-800-FDA-1088. Where should I keep my medicine? This drug is given in a hospital or clinic and will not be stored at home. NOTE: This sheet is a summary. It may not cover all possible information. If you have questions about this medicine, talk to your doctor, pharmacist, or health care provider.    2016, Elsevier/Gold Standard. (2014-08-06 17:24:19)

## 2015-08-18 ENCOUNTER — Encounter (HOSPITAL_COMMUNITY): Payer: Self-pay | Admitting: Lab

## 2015-08-18 NOTE — Progress Notes (Signed)
Referral sent to [facility] Dr Dub Amis. Records faxed on 2/27

## 2015-08-20 NOTE — Progress Notes (Addendum)
Julie Ponto, MD [place] [address]  Colon cancer metastasized to multiple sites [facility]) - Plan: Oxycodone HCl 10 MG TABS  Urinary pain - Plan: Urinalysis, Routine w reflex microscopic, Urinalysis, Routine w reflex microscopic, Urine microscopic-add on, nitrofurantoin, macrocrystal-monohydrate, (MACROBID) 100 MG capsule  CURRENT THERAPY: Work-up  INTERVAL HISTORY: Julie Galvan 65 y.o. 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    Colon cancer metastasized to multiple sites [facility])   08/18/2011 Initial Diagnosis Status post appendectomy.  Cancer found within specimen.  Underwent followup open right proximal colectomy for per 2013.  T3 N0.  0/16 lymph nodes positive.  Declined followup.   10/18/2013 Imaging CT abd/pelvis- severe right-sided hydronephrosis    Survivorship Found to have hydronephrosis and masses in the pelvis very suspicious for metastatic disease.  Recommendation made for chemotherapy.   10/31/2013 PET scan Multiple hypermetabolic soft tissue lesions scattered through the lower mesentery and bilateral pelvis, consistent with metastatic disease.   11/26/2013 - 06/18/2014 Chemotherapy FOLFOX + Avastin by Dr. Jacquiline Doe at [facility]   07/01/2014 - 07/15/2014 Radiation Therapy Dr. Tammi Klippel   11/13/2014 - 12/09/2014 Radiation Therapy 28 Gy in 10 fractions to residula pelvic lymph nodes by Dr. Tammi Klippel   08/04/2015 [facility] Admission SBO   08/04/2015 Tumor Marker CEA: 28.4 (H)    08/04/2015 Imaging CT abd/pelvis- Distal small bowel obstruction at the level of the right pelvic sidewall tumor implant, which is decreased in size since  07/24/2014. 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    08/06/2015 Imaging CT chest- Scattered pulmonary nodules and distal periaortic adenopathy, new from 10/31/2013, indicative of metastatic disease.    I personally reviewed and went over laboratory results with the patient.  The results are noted within this dictation. No role for updated labs today.  I personally reviewed and went over radiographic studies with the patient.  The results are noted within this dictation.    Her case has been discussed with Dr. Michael Boston ([facility]) who recommends referral to tertiary center for consideration of oncologic surgery. She does have an appointment tomorrow, 08/22/2015, with Dr. Johney Maine [facility]) for consideration of surgical intervention. The patient is aware of this appointment date and time.  The patient and family had a number of questions regarding surgery and I deferred all of these to Dr. Johney Maine.  Further medical oncology recommendations will follow based upon surgical oncology recommendations.  The patient reports urinary burning with each urination. She also reports urinary frequency and issues with incomplete voiding. As result, we'll check a urinalysis today. I will treat accordingly if indicated.  The patient asked about her status regarding K-ras testing. I'll have this information available to me at this point in time but I will check on this today.  Past Medical History  Diagnosis Date  . Hypertension   . Heart palpitations     intermittent" especially if forgets to take Metoprolol."  . Anxiety   . H/O seasonal allergies 11-20-13    "causes coughing"  . Chronic kidney disease     hx. of kidney stones and right ureteral stent placed due to mestatic cancer  . GERD (  gastroesophageal reflux disease)   . Arthritis     fingers  . Colon cancer metastasized to multiple sites [facility])     Cecum [id number]  . 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     is allergic to sulfa antibiotics.  Current Outpatient Prescriptions on File Prior to Visit  Medication Sig Dispense Refill  . feeding supplement (BOOST / RESOURCE BREEZE) LIQD Take 1 Container by mouth 3 (three) times daily between meals. (Patient taking differently: Take 1 Container by mouth daily. Using regular boost) 10 Container 10  . Frankincense OIL Take 1 capsule by mouth every 3 (three) days.     . metoprolol tartrate (LOPRESSOR) 25 MG tablet Take 25 mg by mouth 2 (two) times daily.    . polyethylene glycol (MIRALAX / GLYCOLAX) packet Take 17 g by mouth daily as needed.    . ranitidine (ZANTAC) 150 MG tablet Take 150 mg by mouth 2 (two) times daily.     No current facility-administered medications on file prior to visit.    Past Surgical History  Procedure Laterality Date  . Ureteral stent placement Right 11-21-13    done at [facility]  . Appendectomy  Jan 2013    1'13 dx. cancer  . Colon surgery Right 08/18/2011    colon resection for cancer and agin for obstruction  . Abdominal hysterectomy    . Breast surgery      left breast lumpectomy-benign-40 yrs ago  . Portacath placement N/A 11/22/2013    Procedure: INSERTION PORT-A-CATH LEFT SUBCLAVIAN VEIN;  Surgeon: Shann Medal, MD;  Location: [facility];  Service: General;  Laterality: N/A;    Denies any headaches, dizziness, double vision, fevers, chills, night sweats, nausea, vomiting, diarrhea, constipation, chest pain, heart palpitations, shortness of breath, blood in stool, black tarry stool, hematuria.   PHYSICAL EXAMINATION  ECOG PERFORMANCE STATUS: 1 - Symptomatic but completely ambulatory  Filed Vitals:   08/21/15  0920  BP: 119/60  Pulse: 94  Temp: 97.8 F (36.6 C)  Resp: 16    GENERAL:alert, no distress, well nourished, well developed, comfortable, cooperative, smiling and accompanied by 2 sisters and her husband. SKIN: skin color, texture, turgor are normal, no rashes or significant lesions HEAD: Normocephalic, No masses, lesions, tenderness or abnormalities EYES: normal, EOMI, Conjunctiva are pink and non-injected EARS: External ears normal OROPHARYNX:lips, buccal mucosa, and tongue normal and mucous membranes are moist  NECK: supple, trachea midline LYMPH:  not examined BREAST:not examined LUNGS: Not examined HEART: Not examined  ABDOMEN:non-tender BACK: Back symmetric, no curvature. EXTREMITIES:less then 2 second capillary refill, no skin discoloration, no cyanosis  NEURO: alert & oriented x 3 with fluent speech, no focal motor/sensory deficits, gait normal    LABORATORY DATA: CBC    Component Value Date/Time   WBC 6.3 08/04/2015 1455   RBC 5.13* 08/04/2015 1455   HGB 14.0 08/04/2015 1455   HCT 43.7 08/04/2015 1455   PLT 273 08/04/2015 1455   MCV 85.2 08/04/2015 1455   MCH 27.3 08/04/2015 1455   MCHC 32.0 08/04/2015 1455   RDW 13.2 08/04/2015 1455      Chemistry      Component Value Date/Time   NA 140 08/04/2015 1455   K 4.1 08/04/2015 1455   CL 102 08/04/2015 1455  CO2 26 08/04/2015 1455   BUN 10 08/04/2015 1455   CREATININE 0.83 08/04/2015 1455      Component Value Date/Time   CALCIUM 9.8 08/04/2015 1455   ALKPHOS 64 08/04/2015 1455   AST 11* 08/04/2015 1455   ALT 10* 08/04/2015 1455   BILITOT 1.2 08/04/2015 1455     Lab Results  Component Value Date   CEA 28.4* 08/04/2015      PENDING LABS:   RADIOGRAPHIC STUDIES:  Ct Chest Wo Contrast  08/06/2015  CLINICAL DATA:  Staging metastatic colon cancer, weight loss, vomiting and diarrhea. EXAM: CT CHEST WITHOUT CONTRAST TECHNIQUE: Multidetector CT imaging of the chest was performed following the  standard protocol without IV contrast. COMPARISON:  CT abdomen pelvis 08/04/2015 and PET 10/31/2013. FINDINGS: Mediastinum/Nodes: Left subclavian Port-A-Cath terminates in the SVC. No pathologically enlarged mediastinal or axillary lymph nodes. Hilar regions are difficult to definitively evaluate without IV contrast. Heart size normal. Left anterior descending coronary artery calcification. No pericardial effusion. Lymph node adjacent to the lower descending thoracic aorta measures 9 mm. Lungs/Pleura: Scattered pulmonary nodules appear new from 10/31/2013. Index nodule in the subpleural right lower lobe measures 7 x 12 mm (series 3, image 40). No pleural fluid. Airway is unremarkable. Upper abdomen: Hepatic and left renal lesions, as well as retroperitoneal adenopathy, better seen on diagnostic examination of 08/04/2015. Musculoskeletal: Scattered sclerotic lesions in the spine appear to have been present on 10/31/2013. Degenerative changes are seen in the spine as well. IMPRESSION: 1. Scattered pulmonary nodules and distal periaortic adenopathy, new from 10/31/2013, indicative of metastatic disease. 2. Retroperitoneal adenopathy better seen on 08/04/2015. 3. Left anterior descending coronary artery calcification. Electronically Signed   By: Lorin Picket M.D.   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 EXAM: CT ABDOMEN AND PELVIS WITH CONTRAST TECHNIQUE: Multidetector CT imaging of the abdomen and pelvis was performed using the standard protocol following bolus administration of intravenous contrast. CONTRAST:  123m OMNIPAQUE IOHEXOL 300 MG/ML  SOLN COMPARISON:  07/24/2014 CT abdomen/pelvis. FINDINGS: Lower chest: No significant pulmonary nodules or acute consolidative airspace disease. Hepatobiliary: Stable 3.4 cm segment 4A, 1.2  cm segment 4B and 0.9 cm segment 6 liver hemangiomas, all unchanged since 07/05/2011. No new liver masses. Stable focal adenomyomatosis in the gallbladder fundus. Stable 0.5 cm dense focus in the dependent gallbladder wall, either a polyp or adherent stone. Otherwise no gallbladder wall thickening or pericholecystic fluid. No biliary ductal dilatation. Pancreas: Normal, with no mass or duct dilation. Spleen: Normal size. No mass. Adrenals/Urinary Tract: Normal adrenals. No hydronephrosis. Right percutaneous nephrostomy tube is stable in position with the distal pigtail within the right renal pelvis. Expected small amount of gas in the right renal collecting system from instrumentation. Stable asymmetric atrophy of the right kidney. Simple 1.5 cm renal cyst in the posterior upper left kidney. There is dilatation of the lumbar segment of the right ureter. Normal bladder. Stomach/Bowel: Grossly normal stomach. There is moderate dilatation of the proximal and mid small bowel to the level of the right pelvic small bowel, with a focal small bowel caliber transition in the right pelvis (series 2/ image 45 and series 3/ image 42), which is adjacent to the 3.0 x 1.8 cm right pelvic sidewall heterogeneous mass (series 2/ image 51), which is decreased in size from 6.9 x 5.9 cm on 07/24/2014. 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 Stable postsurgical changes in the right abdomen status post ileocecal resection with stable appearance of the neo ileocolic anastomosis. Circumferential wall thickening in the mid to distal sigmoid colon is nonspecific and most suggestive of colitis. Vascular/Lymphatic: Atherosclerotic nonaneurysmal abdominal aorta. Patent portal, splenic, hepatic and renal veins. There is new mild-to-moderate aortocaval and right common iliac lymphadenopathy, for example a 1.5 cm aortocaval node (series 2/ image 25) and  a 1.3 cm right common iliac node (series 2/ image 37). Reproductive: Status post hysterectomy, with no new abnormality at the vaginal cuff. Other: No pneumoperitoneum. Left deep pelvic heterogeneous 3.0 x 2.8 cm mass (series 2/ image 57), previously 3.9 x 2.5 cm, not definitely changed. Left anterior peritoneal 1.6 cm tumor implant adjacent to the left rectus muscle sheath (series 2/image 45), increased from 0.7 cm. Anterior right lower quadrant 2.3 x 2.1 cm peritoneal tumor implant (series 2/image 42), previously 1.0 x 1.0 cm, increased. Musculoskeletal: No aggressive appearing focal osseous lesions. Mild degenerative changes in the visualized thoracolumbar spine. IMPRESSION: 1. Distal small bowel obstruction at the level of the right pelvic sidewall tumor implant, which is decreased in size since 07/24/2014. 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 Wall thickening in the mid to distal sigmoid colon suggests a nonspecific sigmoid colitis. 2. New retroperitoneal and right common iliac lymphadenopathy, probably metastatic . 3. Interval growth of peritoneal tumor implants in the anterior right lower quadrant and abutting the left rectus muscle sheath. Stable left pelvic side wall peritoneal tumor implant. 4. No hydronephrosis. Stable well-positioned right percutaneous nephrostomy tube. Stable asymmetric atrophy of the right kidney. Electronically Signed   By: Ilona Sorrel M.D.   On: 08/04/2015 17:58   US Venous Img Lower Unilateral Right  08/14/2015  CLINICAL DATA:  Right lower extremity pain and swelling for 1 week. EXAM: Right LOWER EXTREMITY VENOUS DOPPLER ULTRASOUND TECHNIQUE: Gray-scale sonography with graded compression, as well as color Doppler and duplex ultrasound were performed to evaluate the lower extremity deep venous systems from the level of the common femoral vein and including the common femoral, femoral, profunda femoral, popliteal and  calf veins including the posterior tibial, peroneal and gastrocnemius veins when visible. The superficial great saphenous vein was also interrogated. Spectral Doppler was utilized to evaluate flow at rest and with distal augmentation maneuvers in the common femoral, femoral and popliteal veins. COMPARISON:  None. FINDINGS: Contralateral Common Femoral Vein: Respiratory phasicity is normal and symmetric with the symptomatic side. No evidence of thrombus. Normal compressibility. Common Femoral Vein: No evidence of thrombus. Normal compressibility, respiratory phasicity and response to augmentation. Saphenofemoral Junction: No evidence of thrombus. Normal compressibility and flow on color Doppler imaging. Profunda Femoral Vein: No evidence of thrombus. Normal compressibility and flow on color Doppler imaging. Femoral Vein: No evidence of thrombus. Normal compressibility, respiratory phasicity and response to augmentation. Popliteal Vein: No evidence of thrombus. Normal compressibility, respiratory phasicity and response to augmentation. Calf Veins: No evidence of thrombus. Normal compressibility and flow on color Doppler imaging. Superficial Great Saphenous Vein: No evidence of thrombus. Normal compressibility and flow on color Doppler imaging. Venous Reflux:  None. Other Findings:  None. IMPRESSION: No evidence of deep venous thrombosis. Electronically Signed   By: Marijo Sanes M.D.   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  Patient's case has been discussed with Dr. Michael Boston ([facility]) and he agrees with  referral to [facility] for surgical oncology consultation.  She has been referred to Dr. Johney Maine and has an appointment on Friday, 08/22/2015, with him.  She complains of urinary burning and frequency and therefore I will order a UA today with reflex.  No role for labs today.  I will check on KRAS status testing as I am not privy to that information at this time.  She will tentatively return in 1-2 weeks for further medical oncology recommendations based upon surgical oncology consultation. We can certainly change his appointment date accordingly if indicated.  Addendum: UA returned.  No too impressive.  Given her symptoms, I will escribe Macrobid x 5 days.  This is escribed to her pharmacy.     THERAPY PLAN:  She will see Dr. Johney Maine [facility]) on 08/22/2015 for surgical oncology recommendations. Based upon this, medical oncology recommendations will follow.   All questions were answered. The patient knows to call the clinic with any problems, questions or concerns. We can certainly see the patient much sooner if necessary.  Patient and plan discussed with Dr. Ancil Linsey and she is in agreement with the aforementioned.   This note is electronically signed by: Doy Mince 08/21/2015 4:07 PM

## 2015-08-20 NOTE — Assessment & Plan Note (Addendum)
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  Patient's case has been discussed with Dr. Michael Boston ([facility]) and he agrees with referral to [facility] for surgical oncology consultation.  She has been referred to Dr. Johney Maine and has an appointment on Friday, 08/22/2015, with him.  She complains of urinary burning and frequency and therefore I will order a UA today with reflex.  No role for labs today.  I will check on KRAS status testing as I am not privy to that information at this time.  She will tentatively return in 1-2 weeks for further medical oncology recommendations based upon surgical oncology consultation. We can certainly change his appointment date accordingly if indicated.  Addendum: UA returned.  No too impressive.  Given her symptoms, I will escribe Macrobid x 5 days.  This is escribed to her pharmacy.

## 2015-08-21 ENCOUNTER — Encounter (HOSPITAL_COMMUNITY): Payer: BLUE CROSS/BLUE SHIELD | Attending: Oncology | Admitting: Oncology

## 2015-08-21 ENCOUNTER — Other Ambulatory Visit (HOSPITAL_COMMUNITY): Payer: Self-pay | Admitting: Oncology

## 2015-08-21 VITALS — BP 119/60 | HR 94 | Temp 97.8°F | Resp 16 | Wt 115.0 lb

## 2015-08-21 DIAGNOSIS — E876 Hypokalemia: Secondary | ICD-10-CM | POA: Diagnosis present

## 2015-08-21 DIAGNOSIS — R350 Frequency of micturition: Secondary | ICD-10-CM

## 2015-08-21 DIAGNOSIS — C801 Malignant (primary) neoplasm, unspecified: Secondary | ICD-10-CM | POA: Diagnosis present

## 2015-08-21 DIAGNOSIS — C786 Secondary malignant neoplasm of retroperitoneum and peritoneum: Secondary | ICD-10-CM | POA: Insufficient documentation

## 2015-08-21 DIAGNOSIS — R112 Nausea with vomiting, unspecified: Secondary | ICD-10-CM | POA: Insufficient documentation

## 2015-08-21 DIAGNOSIS — Z95828 Presence of other vascular implants and grafts: Secondary | ICD-10-CM | POA: Diagnosis present

## 2015-08-21 DIAGNOSIS — R309 Painful micturition, unspecified: Secondary | ICD-10-CM | POA: Insufficient documentation

## 2015-08-21 DIAGNOSIS — C189 Malignant neoplasm of colon, unspecified: Secondary | ICD-10-CM | POA: Diagnosis not present

## 2015-08-21 LAB — URINALYSIS, ROUTINE W REFLEX MICROSCOPIC
BILIRUBIN URINE: NEGATIVE
GLUCOSE, UA: NEGATIVE mg/dL
Ketones, ur: NEGATIVE mg/dL
Leukocytes, UA: NEGATIVE
Nitrite: NEGATIVE
PROTEIN: 30 mg/dL — AB
SPECIFIC GRAVITY, URINE: 1.025 (ref 1.005–1.030)
pH: 6 (ref 5.0–8.0)

## 2015-08-21 LAB — URINE MICROSCOPIC-ADD ON: WBC, UA: NONE SEEN WBC/hpf (ref 0–5)

## 2015-08-21 MED ORDER — NITROFURANTOIN MONOHYD MACRO 100 MG PO CAPS: 100.0000 mg | ORAL_CAPSULE | Freq: Two times a day (BID) | ORAL | Status: DC

## 2015-08-21 NOTE — Addendum Note (Signed)
Addended by: Baird Cancer on: 08/21/2015 04:08 PM   Modules accepted: Orders

## 2015-08-21 NOTE — Patient Instructions (Signed)
[facility] at [facility] Discharge Instructions  RECOMMENDATIONS MADE BY THE CONSULTANT AND ANY TEST RESULTS WILL BE SENT TO YOUR REFERRING PHYSICIAN.  Exam and discussion today with Kirby Crigler, PA. Urinalysis today. Return for office visit as scheduled.   Thank you for choosing [place] at [facility] to provide your oncology and hematology care.  To afford each patient quality time with our provider, please arrive at least 15 minutes before your scheduled appointment time.   Beginning January 23rd 2017 lab work for the [name] will be done in the  Main lab at [facility] on 1st floor. If you have a lab appointment with the [facility] please come in thru the  Main Entrance and check in at the main information desk  You need to re-schedule your appointment should you arrive 10 or more minutes late.  We strive to give you quality time with our providers, and arriving late affects you and other patients whose appointments are after yours.  Also, if you no show three or more times for appointments you may be dismissed from the clinic at the providers discretion.     Again, thank you for choosing [facility].  Our hope is that these requests will decrease the amount of time that you wait before being seen by our physicians.       _____________________________________________________________  Should you have questions after your visit to [facility], please contact our office at [phone] between the hours of 8:30 a.m. and 4:30 p.m.  Voicemails left after 4:30 p.m. will not be returned until the following business day.  For prescription refill requests, have your pharmacy contact our office.         Resources For Cancer Patients and their Caregivers ? American Cancer Society: Can assist with transportation, wigs, general needs, runs Look Good Feel Better.        780 810 2443 ? Cancer Care: Provides  financial assistance, online support groups, medication/co-pay assistance.  1-800-813-HOPE (450)663-5472) ? [facility] Assists [place] cancer patients and their families through emotional , educational and financial support.  947-826-1832 ? [place] DSS Where to apply for food stamps, Medicaid and utility assistance. (820)318-9210 ? [facility]: Transportation to medical appointments. 615-708-0978 ? Social Security Administration: May apply for disability if have a Stage IV cancer. (719)411-9706 337-501-3155 ? [employer], Disability and Transit Services: Assists with nutrition, care and transit needs. 6198883715

## 2015-08-26 ENCOUNTER — Other Ambulatory Visit (HOSPITAL_COMMUNITY): Payer: Self-pay | Admitting: *Deleted

## 2015-08-26 DIAGNOSIS — C189 Malignant neoplasm of colon, unspecified: Secondary | ICD-10-CM

## 2015-08-26 MED ORDER — ONDANSETRON 8 MG PO TBDP: 8.0000 mg | ORAL_TABLET | Freq: Three times a day (TID) | ORAL | Status: AC | PRN

## 2015-08-26 MED ORDER — PROCHLORPERAZINE MALEATE 10 MG PO TABS: 10.0000 mg | ORAL_TABLET | Freq: Four times a day (QID) | ORAL | Status: AC | PRN

## 2015-08-28 ENCOUNTER — Other Ambulatory Visit (HOSPITAL_COMMUNITY): Payer: Self-pay | Admitting: Hematology & Oncology

## 2015-08-28 ENCOUNTER — Other Ambulatory Visit (HOSPITAL_COMMUNITY): Payer: Self-pay | Admitting: *Deleted

## 2015-08-28 DIAGNOSIS — C189 Malignant neoplasm of colon, unspecified: Secondary | ICD-10-CM

## 2015-08-28 DIAGNOSIS — N390 Urinary tract infection, site not specified: Secondary | ICD-10-CM | POA: Insufficient documentation

## 2015-08-28 MED ORDER — CIPROFLOXACIN HCL 500 MG PO TABS: 500.0000 mg | ORAL_TABLET | Freq: Two times a day (BID) | ORAL | Status: DC

## 2015-08-31 NOTE — Progress Notes (Signed)
Julie Ponto, MD [place] [address]  Colon cancer metastasized to multiple sites [facility]) - Plan: CBC with Differential, Comprehensive metabolic panel, Magnesium, CEA  CURRENT THERAPY: FOLFIRI beginning on 09/02/2015  INTERVAL HISTORY: Julie Galvan 65 y.o. 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    Colon cancer metastasized to multiple sites [facility])   08/18/2011 Initial Diagnosis Status post appendectomy.  Cancer found within specimen.  Underwent followup open right proximal colectomy for per 2013.  T3 N0.  0/16 lymph nodes positive.  Declined followup.   10/18/2013 Imaging CT abd/pelvis- severe right-sided hydronephrosis    Survivorship Found to have hydronephrosis and masses in the pelvis very suspicious for metastatic disease.  Recommendation made for chemotherapy.   10/31/2013 PET scan Multiple hypermetabolic soft tissue lesions scattered through the lower mesentery and bilateral pelvis, consistent with metastatic disease.   11/26/2013 - 06/18/2014 Chemotherapy FOLFOX + Avastin by Dr. Jacquiline Doe at [facility]   07/01/2014 - 07/15/2014 Radiation Therapy Dr. Tammi Klippel   11/13/2014 - 12/09/2014 Radiation Therapy 28 Gy in 10 fractions to residula pelvic lymph nodes by Dr. Tammi Klippel   08/04/2015 [facility] Admission SBO   08/04/2015 Tumor Marker CEA: 28.4 (H)    08/04/2015 Imaging CT abd/pelvis- Distal small bowel obstruction at the level of the right pelvic sidewall tumor implant, which is decreased in size since 07/24/2014. 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    08/06/2015 Imaging CT  chest- Scattered pulmonary nodules and distal periaortic adenopathy, new from 10/31/2013, indicative of metastatic disease.   08/22/2015 Treatment Plan Change Consult with Dr. Dell Galvan: e discussed that as her disease is not contained to the peritoneum, she is not a candidate for HIPEC at this time. We recommend to continue chemotherapy for this but should avoid any further radiation therapy.    Chemotherapy FOLFIRI    I personally reviewed and went over laboratory results with the patient.  The results are noted within this dictation. No role for updated labs today.  I personally reviewed and went over radiographic studies with the patient.  The results are noted within this dictation.    She was seen by Dr. Johney Maine at [facility] for a surgical opinion.  His A/P follows:  ASSESSMENT/PLAN: Julie Galvan is a 65 y.o. female who presents today to the [facility] for the first time for an assessment of peritoneal carcinomatosis with colon adenocarcinoma primary. We discussed the implications of this diagnosis at length and in detail. We discussed that as her disease is not contained to the peritoneum, she is not a candidate for HIPEC at this time. We recommend to continue chemotherapy for this but should avoid any further radiation therapy. She will follow up in clinic as needed.  She is undergoing chemotherapy teaching today. She has no questions for me today.  The patient husband reports some constipation with her last bowel movement approximately 2 days ago. She is given a constipation education sheet today.  He also mentions some decrease in appetite.  Our nurse navigator will consult Burtis Junes. The patient's husband was able to ascertain some marijuana. He reports that the patient took 2 hits  of the joint and her appetite was much improved.  Past Medical History  Diagnosis Date  . Hypertension   . Heart palpitations     intermittent" especially if forgets to take Metoprolol."    . Anxiety   . H/O seasonal allergies 11-20-13    "causes coughing"  . Chronic kidney disease     hx. of kidney stones and right ureteral stent placed due to mestatic cancer  . GERD (gastroesophageal reflux disease)   . Arthritis     fingers  . Colon cancer metastasized to multiple sites [facility])     Cecum [id number]  . Peritoneal carcinomatosis [facility])     has Colon cancer metastasized to multiple sites [facility]); SBO (small bowel obstruction) ([place]); Volume depletion; Weight loss; Nausea & vomiting; Abdominal pain; Colitis; Colo-enteric fistula; Peritoneal carcinomatosis ([place]); Malnutrition of moderate degree; and Infection of urinary tract on her problem list.     is allergic to sulfa antibiotics.  Current Outpatient Prescriptions on File Prior to Visit  Medication Sig Dispense Refill  . ciprofloxacin (CIPRO) 500 MG tablet Take 1 tablet (500 mg total) by mouth 2 (two) times daily. 10 tablet 0  . feeding supplement (BOOST / RESOURCE BREEZE) LIQD Take 1 Container by mouth 3 (three) times daily between meals. (Patient taking differently: Take 1 Container by mouth daily. Using regular boost) 10 Container 10  . Frankincense OIL Take 1 capsule by mouth every 3 (three) days.     . metoprolol tartrate (LOPRESSOR) 25 MG tablet Take 25 mg by mouth 2 (two) times daily.    . nitrofurantoin, macrocrystal-monohydrate, (MACROBID) 100 MG capsule Take 1 capsule (100 mg total) by mouth 2 (two) times daily. 10 capsule 0  . ondansetron (ZOFRAN ODT) 8 MG disintegrating tablet Take 1 tablet (8 mg total) by mouth every 8 (eight) hours as needed for nausea or vomiting. 30 tablet 1  . Oxycodone HCl 10 MG TABS Take 10 mg by mouth every 6 (six) hours as needed.  0  . polyethylene glycol (MIRALAX / GLYCOLAX) packet Take 17 g by mouth daily as needed.    . prochlorperazine (COMPAZINE) 10 MG tablet Take 1 tablet (10 mg total) by mouth every 6 (six) hours as needed for nausea or vomiting. 30 tablet 2  . ranitidine (ZANTAC) 150 MG  tablet Take 150 mg by mouth 2 (two) times daily.     No current facility-administered medications on file prior to visit.    Past Surgical History  Procedure Laterality Date  . Ureteral stent placement Right 11-21-13    done at [facility]  . Appendectomy  Jan 2013    1'13 dx. cancer  . Colon surgery Right 08/18/2011    colon resection for cancer and agin for obstruction  . Abdominal hysterectomy    . Breast surgery      left breast lumpectomy-benign-40 yrs ago  . Portacath placement N/A 11/22/2013    Procedure: INSERTION PORT-A-CATH LEFT SUBCLAVIAN VEIN;  Surgeon: Shann Medal, MD;  Location: [facility];  Service: General;  Laterality: N/A;    Denies any headaches, dizziness, double vision, fevers, chills, night sweats, nausea, vomiting, diarrhea, constipation, chest pain, heart palpitations, shortness of breath, blood in stool, black tarry stool, hematuria.   PHYSICAL EXAMINATION  ECOG PERFORMANCE STATUS: 1 - Symptomatic but completely ambulatory  Filed Vitals:   09/02/15 0845  BP: 109/57  Pulse: 76  Temp: 98.6 F (37 C)  Resp: 16    GENERAL:alert, no distress, well nourished, well developed, comfortable, cooperative,  smiling, in chemotherapy recliner, and accompanied husband. SKIN: skin color, texture, turgor are normal, no rashes or significant lesions HEAD: Normocephalic, No masses, lesions, tenderness or abnormalities EYES: normal, EOMI, Conjunctiva are pink and non-injected EARS: External ears normal OROPHARYNX:lips, buccal mucosa, and tongue normal and mucous membranes are moist  NECK: supple, trachea midline LYMPH:  not examined BREAST:not examined LUNGS: Clear to auscultation bilaterally without wheezes, rales, rhonchi. HEART: Regular rhythm and rate, without murmur, rub, or gallop. Normal S1 and S2. ABDOMEN:non-tender BACK: Back symmetric, no curvature. EXTREMITIES:less then 2 second capillary refill, no skin discoloration, no cyanosis  NEURO: alert & oriented x  3 with fluent speech, no focal motor/sensory deficits   LABORATORY DATA: CBC    Component Value Date/Time   WBC 10.6* 09/02/2015 0835   RBC 4.13 09/02/2015 0835   HGB 11.2* 09/02/2015 0835   HCT 34.9* 09/02/2015 0835   PLT 316 09/02/2015 0835   MCV 84.5 09/02/2015 0835   MCH 27.1 09/02/2015 0835   MCHC 32.1 09/02/2015 0835   RDW 13.9 09/02/2015 0835   LYMPHSABS 0.6* 09/02/2015 0835   MONOABS 1.0 09/02/2015 0835   EOSABS 0.0 09/02/2015 0835   BASOSABS 0.0 09/02/2015 0835      Chemistry      Component Value Date/Time   NA 134* 09/02/2015 0835   K 4.0 09/02/2015 0835   CL 99* 09/02/2015 0835   CO2 27 09/02/2015 0835   BUN 12 09/02/2015 0835   CREATININE 0.82 09/02/2015 0835      Component Value Date/Time   CALCIUM 8.4* 09/02/2015 0835   ALKPHOS 56 09/02/2015 0835   AST 10* 09/02/2015 0835   ALT 14 09/02/2015 0835   BILITOT 0.9 09/02/2015 0835     Lab Results  Component Value Date   CEA 28.4* 08/04/2015      PENDING LABS:   RADIOGRAPHIC STUDIES:  Ct Chest Wo Contrast  08/06/2015  CLINICAL DATA:  Staging metastatic colon cancer, weight loss, vomiting and diarrhea. EXAM: CT CHEST WITHOUT CONTRAST TECHNIQUE: Multidetector CT imaging of the chest was performed following the standard protocol without IV contrast. COMPARISON:  CT abdomen pelvis 08/04/2015 and PET 10/31/2013. FINDINGS: Mediastinum/Nodes: Left subclavian Port-A-Cath terminates in the SVC. No pathologically enlarged mediastinal or axillary lymph nodes. Hilar regions are difficult to definitively evaluate without IV contrast. Heart size normal. Left anterior descending coronary artery calcification. No pericardial effusion. Lymph node adjacent to the lower descending thoracic aorta measures 9 mm. Lungs/Pleura: Scattered pulmonary nodules appear new from 10/31/2013. Index nodule in the subpleural right lower lobe measures 7 x 12 mm (series 3, image 40). No pleural fluid. Airway is unremarkable. Upper abdomen:  Hepatic and left renal lesions, as well as retroperitoneal adenopathy, better seen on diagnostic examination of 08/04/2015. Musculoskeletal: Scattered sclerotic lesions in the spine appear to have been present on 10/31/2013. Degenerative changes are seen in the spine as well. IMPRESSION: 1. Scattered pulmonary nodules and distal periaortic adenopathy, new from 10/31/2013, indicative of metastatic disease. 2. Retroperitoneal adenopathy better seen on 08/04/2015. 3. Left anterior descending coronary artery calcification. Electronically Signed   By: Lorin Picket M.D.   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 EXAM: CT ABDOMEN AND PELVIS WITH CONTRAST TECHNIQUE: Multidetector CT imaging of the abdomen and pelvis was performed using the standard protocol following bolus administration of intravenous contrast. CONTRAST:  159mL  OMNIPAQUE IOHEXOL 300 MG/ML  SOLN COMPARISON:  07/24/2014 CT abdomen/pelvis. FINDINGS: Lower chest: No significant pulmonary nodules or acute consolidative airspace disease. Hepatobiliary: Stable 3.4 cm segment 4A, 1.2 cm segment 4B and 0.9 cm segment 6 liver hemangiomas, all unchanged since 07/05/2011. No new liver masses. Stable focal adenomyomatosis in the gallbladder fundus. Stable 0.5 cm dense focus in the dependent gallbladder wall, either a polyp or adherent stone. Otherwise no gallbladder wall thickening or pericholecystic fluid. No biliary ductal dilatation. Pancreas: Normal, with no mass or duct dilation. Spleen: Normal size. No mass. Adrenals/Urinary Tract: Normal adrenals. No hydronephrosis. Right percutaneous nephrostomy tube is stable in position with the distal pigtail within the right renal pelvis. Expected small amount of gas in the right renal collecting system from instrumentation. Stable  asymmetric atrophy of the right kidney. Simple 1.5 cm renal cyst in the posterior upper left kidney. There is dilatation of the lumbar segment of the right ureter. Normal bladder. Stomach/Bowel: Grossly normal stomach. There is moderate dilatation of the proximal and mid small bowel to the level of the right pelvic small bowel, with a focal small bowel caliber transition in the right pelvis (series 2/ image 45 and series 3/ image 42), which is adjacent to the 3.0 x 1.8 cm right pelvic sidewall heterogeneous mass (series 2/ image 51), which is decreased in size from 6.9 x 5.9 cm on 07/24/2014. 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 Stable postsurgical changes in the right abdomen status post ileocecal resection with stable appearance of the neo ileocolic anastomosis. Circumferential wall thickening in the mid to distal sigmoid colon is nonspecific and most suggestive of colitis. Vascular/Lymphatic: Atherosclerotic nonaneurysmal abdominal aorta. Patent portal, splenic, hepatic and renal veins. There is new mild-to-moderate aortocaval and right common iliac lymphadenopathy, for example a 1.5 cm aortocaval node (series 2/ image 25) and a 1.3 cm right common iliac node (series 2/ image 37). Reproductive: Status post hysterectomy, with no new abnormality at the vaginal cuff. Other: No pneumoperitoneum. Left deep pelvic heterogeneous 3.0 x 2.8 cm mass (series 2/ image 57), previously 3.9 x 2.5 cm, not definitely changed. Left anterior peritoneal 1.6 cm tumor implant adjacent to the left rectus muscle sheath (series 2/image 45), increased from 0.7 cm. Anterior right lower quadrant 2.3 x 2.1 cm peritoneal tumor implant (series 2/image 42), previously 1.0 x 1.0 cm, increased. Musculoskeletal: No aggressive appearing focal osseous lesions. Mild degenerative changes in the visualized thoracolumbar spine. IMPRESSION: 1. Distal small  bowel obstruction at the level of the right pelvic sidewall tumor implant, which is decreased in size since 07/24/2014. 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 Wall thickening in the mid to distal sigmoid colon suggests a nonspecific sigmoid colitis. 2. New retroperitoneal and right common iliac lymphadenopathy, probably metastatic . 3. Interval growth of peritoneal tumor implants in the anterior right lower quadrant and abutting the left rectus muscle sheath. Stable left pelvic side wall peritoneal tumor implant. 4. No hydronephrosis. Stable well-positioned right percutaneous nephrostomy tube. Stable asymmetric atrophy of the right kidney. Electronically Signed   By: Ilona Sorrel M.D.   On: 08/04/2015 17:58   US Venous Img Lower Unilateral Right  08/14/2015  CLINICAL DATA:  Right lower extremity pain and swelling for 1 week. EXAM: Right LOWER EXTREMITY VENOUS DOPPLER ULTRASOUND TECHNIQUE: Gray-scale sonography with graded compression, as well as color Doppler and duplex  ultrasound were performed to evaluate the lower extremity deep venous systems from the level of the common femoral vein and including the common femoral, femoral, profunda femoral, popliteal and calf veins including the posterior tibial, peroneal and gastrocnemius veins when visible. The superficial great saphenous vein was also interrogated. Spectral Doppler was utilized to evaluate flow at rest and with distal augmentation maneuvers in the common femoral, femoral and popliteal veins. COMPARISON:  None. FINDINGS: Contralateral Common Femoral Vein: Respiratory phasicity is normal and symmetric with the symptomatic side. No evidence of thrombus. Normal compressibility. Common Femoral Vein: No evidence of thrombus. Normal compressibility, respiratory phasicity and response to augmentation. Saphenofemoral Junction: No evidence of thrombus. Normal compressibility and flow on color  Doppler imaging. Profunda Femoral Vein: No evidence of thrombus. Normal compressibility and flow on color Doppler imaging. Femoral Vein: No evidence of thrombus. Normal compressibility, respiratory phasicity and response to augmentation. Popliteal Vein: No evidence of thrombus. Normal compressibility, respiratory phasicity and response to augmentation. Calf Veins: No evidence of thrombus. Normal compressibility and flow on color Doppler imaging. Superficial Great Saphenous Vein: No evidence of thrombus. Normal compressibility and flow on color Doppler imaging. Venous Reflux:  None. Other Findings:  None. IMPRESSION: No evidence of deep venous thrombosis. Electronically Signed   By: Marijo Sanes M.D.   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  Oncology history is updated.  She has seen Dr. Johney Maine at [facility] for surgical oncology opinion:  ASSESSMENT/PLAN: Julie Galvan is a 65 y.o. female who presents today to the [facility] for the first time for an assessment of peritoneal carcinomatosis with colon adenocarcinoma primary. We discussed the implications of this diagnosis at length and in detail. We discussed that as her disease is not contained to the peritoneum, she is not a candidate for HIPEC at this time. We recommend to continue chemotherapy for this but should avoid any further radiation therapy. She will follow up in clinic as needed.  With this in mind, we will embark on palliative systemic chemotherapy consisting of FOLFIRI.  Pre-treatment labs as ordered: CBC diff, CMET, CEA,  Mag.  Return in 1 week for follow-up/nadir check. Labs will not be checked because the patient refuses a venipuncture for laboratory work.  Return in 2 weeks for follow-up, pre-treatment labs, and next cycle of FOLFIRI.   THERAPY PLAN:  She will start systemic chemotherapy in the palliative setting with FOLFIRI.  All questions were answered. The patient knows to call the clinic with any problems, questions or concerns. We can certainly see the patient much sooner if necessary.  Patient and plan discussed with Dr. Ancil Linsey and she is in agreement with the aforementioned.   This note is electronically signed by: Doy Mince 09/02/2015 2:50 PM

## 2015-08-31 NOTE — Assessment & Plan Note (Addendum)
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  Oncology history is updated.  She has seen Dr. Johney Maine at [facility] for surgical oncology opinion:  ASSESSMENT/PLAN: Julie Galvan is a 65 y.o. female who presents today to the [facility] for the first time for an assessment of peritoneal carcinomatosis with colon adenocarcinoma primary. We discussed the implications of this diagnosis at length and in detail. We discussed that as her disease is not contained to the peritoneum, she is not a candidate for HIPEC at this time. We recommend to continue chemotherapy for this but should avoid any further radiation therapy. She will follow up in clinic as needed.  With this in mind, we will embark on palliative systemic chemotherapy consisting of FOLFIRI.  Pre-treatment labs as ordered: CBC diff, CMET, CEA, Mag.  Return in 1 week for follow-up/nadir check. Labs will not be checked because the patient refuses a venipuncture for laboratory work.  Return in 2 weeks for follow-up, pre-treatment labs, and next cycle of FOLFIRI.

## 2015-09-01 ENCOUNTER — Other Ambulatory Visit (HOSPITAL_COMMUNITY): Payer: Self-pay | Admitting: *Deleted

## 2015-09-01 NOTE — Patient Instructions (Signed)
Julie Galvan   CHEMOTHERAPY INSTRUCTIONS  Premeds: Zofran - for nausea/vomiting prevention/reduction (given with 1st cycle of chemo). Aloxi - for nausea/vomiting prevention/reduction (this will be given after cycle I) Emend - for nausea/vomiting prevention/redcution.  Dexamethasone - steroid - given to reduce the risk of you having an allergic type reaction to the chemotherapy. Dex can cause you to feel energized, nervous/anxious/jittery, make you have trouble sleeping, and/or make you feel hot/flushed in the face/neck and/or look pink/red in the face/neck. These side effects will pass as the Dex wears off. (takes 30 minutes to infuse)  Irinotecan - diarrhea, bone marrow suppression, hair loss, abdominal cramping. This drug can cause early and late diarrhea. Early diarrhea can occur within 24 hours of administration. We recommend Imodium. For diarrhea, you take Imodium 2 tablets @ the onset of diarrhea, and then 1 tablet every 2 hours until 12 hours have passed without a bowel movement. May take 2 tablets @ bedtime and every 4 hours until morning. If diarrhea recurs repeat. Call [facility] and let us know that you are having diarrhea and whether or not the Imodium is working. (takes 90 minutes to infuse).   Leucovorin - this is a medication that is not chemo but given with chemo. This med "rescues" the healthy cells before we administer the drug 5FU. This makes the 5FU work better. (takes 2 hours to infuse- this goes in @ the same time as the Irinotecan chemo)  5FU: bone marrow suppression (low white blood cells - wbcs fight infection, low red blood cells - rbcs make up your blood, low platelets - this is what makes your blood clot, nausea/vomiting, diarrhea, mouth sores, hair loss, dry skin, ocular toxicities (increased tear production, sensitivity to light). You must wear sunscreen/sunglasses. Cover your skin when out in sunlight. You will get burned very easily. The  nurse will sit @ your bedside and push this medication in via a syringe. This will take 10 minutes. Then she will connect you to an ambulatory pump with this medication attached. You will wear the pump for 46 hours. The 5FU chemo will infuse for 46 hours total. Then the pump will be removed).   Neulasta - this medication is not chemo but being given because you have had chemo. It is usually given 27 hours after the completion of chemotherapy. This medication works by boosting your bone marrow's supply of white blood cells. White blood cells are what protect our bodies against infection. The medication is given in the form of a subcutaneous injection. It is given in the fatty tissue of your abdomen. It is a short needle. The major side effect of this medication is bone or muscle pain. The drug of choice to relieve or lessen the pain is Aleve or Ibuprofen. If a physician has ever told you not to take Aleve or Ibuprofen - then don't take it. You should then take Tylenol/acetaminophen. Take either medication as the bottle directs you to.  The level of pain you experience as a result of this injection can range from none, to mild or moderate, or severe. Please let us know if you develop moderate or severe bone pain.      POTENTIAL SIDE EFFECTS OF TREATMENT: Increased Susceptibility to Infection, Vomiting, Constipation, Hair Thinning, Changes in Character of Skin and Nails (brittleness, dryness,etc.), Bone Marrow Suppression, Abdominal Cramping, Complete Hair Loss, Nausea, Diarrhea, Sun Sensitivity and Mouth Sores   SELF IMAGE NEEDS AND REFERRALS MADE: Obtain hair accessories as  soon as possible (wigs, scarves, turbans,caps,etc.)  Referral to Look Good, Feel Better consultant - paper given   EDUCATIONAL MATERIALS GIVEN AND REVIEWED: Chemotherapy and You booklet Specific Instructions Sheets: Irinotecan, Leucovorin, 5FU, Zofran, Aloxi, Emend, Dexamethasone, Compazine, Zofran   SELF CARE ACTIVITIES WHILE  ON CHEMOTHERAPY: Increase your fluid intake 48 hours prior to treatment and drink at least 2 quarts per day after treatment., No alcohol intake., No aspirin or other medications unless approved by your oncologist., Eat foods that are light and easy to digest., Eat foods at cold or room temperature., No fried, fatty, or spicy foods immediately before or after treatment., Have teeth cleaned professionally before starting treatment. Keep dentures and partial plates clean., Use soft toothbrush and do not use mouthwashes that contain alcohol. Biotene is a good mouthwash that is available at most pharmacies or may be ordered by calling [phone]., Use warm salt water gargles (1 teaspoon salt per 1 quart warm water) before and after meals and at bedtime. Or you may rinse with 2 tablespoons of three -percent hydrogen peroxide mixed in eight ounces of water., Always use sunscreen with SPF (Sun Protection Factor) of 30 or higher., Use your nausea medication as directed to prevent nausea., Use your stool softener or laxative as directed to prevent constipation. and Use your anti-diarrheal medication as directed to stop diarrhea.  Please wash your hands for at least 30 seconds using warm soapy water. Handwashing is the #1 way to prevent the spread of germs. Stay away from sick people or people who are getting over a cold. If you develop respiratory systems such as green/yellow mucus production or productive cough or persistent cough let us know and we will see if you need an antibiotic. It is a good idea to keep a pair of gloves on when going into grocery stores/[employer] to decrease your risk of coming into contact with germs on the carts, etc. Carry alcohol hand gel with you at all times and use it frequently if out in public. All foods need to be cooked thoroughly. No raw foods. No medium or undercooked meats, eggs. If your food is cooked medium well, it does not need to be hot pink or saturated with bloody liquid at  all. Vegetables and fruits need to be washed/rinsed under the faucet with a dish detergent before being consumed. You can eat raw fruits and vegetables unless we tell you otherwise but it would be best if you cooked them or bought frozen. Do not eat off of salad bars or hot bars unless you really trust the cleanliness of the restaurant. If you need dental work, please let Dr. Whitney Muse know before you go for your appointment so that we can coordinate the best possible time for you in regards to your chemo regimen. You need to also let your dentist know that you are actively taking chemo. We may need to do labs prior to your dental appointment. We also want your bowels moving at least every other day. If this is not happening, we need to know so that we can get you on a bowel regimen to help you go.      MEDICATIONS: You have been given prescriptions for the following medications:  Zofran/ondansetron 8mg  tablet. Take 1 tablet every 8 hours as needed for nausea/vomiting. (#1 nausea med to take, this can constipate)  Compazine/prochlorperazine 10mg  tablet. Take 1 tablet every 6 hours as needed for nausea/vomiting. (#2 nausea med to take, this can make you sleepy)  EMLA cream.  Apply a quarter size amount to port site 1 hour prior to chemo. Do not rub in. Cover with plastic wrap.   Over-the-Counter Meds:  Miralax 1 capful in 8 oz of fluid daily. May increase to two times a day if needed. This is a stool softener. If this doesn't work proceed you can add:  Senokot S  - start with 1 tablet two times a day and increase to 4 tablets two times a day if needed. (total of 8 tablets in a 24 hour period). This is a stimulant laxative.   Call us if this does not help your bowels move.   Imodium 2mg  capsule. Take 2 capsules after the 1st loose stool and then 1 capsule every 2 hours until you go a total of 12 hours without having a loose stool. Call the [facility] if loose stools continue. If diarrhea occurs  @ bedtime, take 2 capsules @ bedtime. Then take 2 capsules every 4 hours until morning. Call [facility].      SYMPTOMS TO REPORT AS SOON AS POSSIBLE AFTER TREATMENT:  FEVER GREATER THAN 100.5 F  CHILLS WITH OR WITHOUT FEVER  NAUSEA AND VOMITING THAT IS NOT CONTROLLED WITH YOUR NAUSEA MEDICATION  UNUSUAL SHORTNESS OF BREATH  UNUSUAL BRUISING OR BLEEDING  TENDERNESS IN MOUTH AND THROAT WITH OR WITHOUT PRESENCE OF ULCERS  URINARY PROBLEMS  BOWEL PROBLEMS  UNUSUAL RASH    Wear comfortable clothing and clothing appropriate for easy access to any Portacath or PICC line. Let us know if there is anything that we can do to make your therapy better!      I have been informed and understand all of the instructions given to me and have received a copy. I have been instructed to call the clinic [phone] or my family physician as soon as possible for continued medical care, if indicated. I do not have any more questions at this time but understand that I may call the [facility] or the Patient Navigator at [phone] during office hours should I have questions or need assistance in obtaining follow-up care.            Irinotecan injection What is this medicine? IRINOTECAN (ir in oh TEE kan ) is a chemotherapy drug. It is used to treat colon and rectal cancer. This medicine may be used for other purposes; ask your health care provider or pharmacist if you have questions. What should I tell my health care provider before I take this medicine? They need to know if you have any of these conditions: -blood disorders -dehydration -diarrhea -infection (especially a virus infection such as chickenpox, cold sores, or herpes) -liver disease -low blood counts, like low white cell, platelet, or red cell counts -recent or ongoing radiation therapy -an unusual or allergic reaction to irinotecan, sorbitol, other chemotherapy, other medicines, foods, dyes, or  preservatives -pregnant or trying to get pregnant -breast-feeding How should I use this medicine? This drug is given as an infusion into a vein. It is administered in a hospital or clinic by a specially trained health care professional. Talk to your pediatrician regarding the use of this medicine in children. Special care may be needed. Overdosage: If you think you have taken too much of this medicine contact a poison control center or emergency room at once. NOTE: This medicine is only for you. Do not share this medicine with others. What if I miss a dose? It is important not to miss your dose. Call your  doctor or health care professional if you are unable to keep an appointment. What may interact with this medicine? Do not take this medicine with any of the following medications: -atazanavir -certain medicines for fungal infections like itraconazole and ketoconazole -St. John's Wort This medicine may also interact with the following medications: -dexamethasone -diuretics -laxatives -medicines for seizures like carbamazepine, mephobarbital, phenobarbital, phenytoin, primidone -medicines to increase blood counts like filgrastim, pegfilgrastim, sargramostim -prochlorperazine -vaccines This list may not describe all possible interactions. Give your health care provider a list of all the medicines, herbs, non-prescription drugs, or dietary supplements you use. Also tell them if you smoke, drink alcohol, or use illegal drugs. Some items may interact with your medicine. What should I watch for while using this medicine? Your condition will be monitored carefully while you are receiving this medicine. You will need important blood work done while you are taking this medicine. This drug may make you feel generally unwell. This is not uncommon, as chemotherapy can affect healthy cells as well as cancer cells. Report any side effects. Continue your course of treatment even though you feel ill unless  your doctor tells you to stop. In some cases, you may be given additional medicines to help with side effects. Follow all directions for their use. You may get drowsy or dizzy. Do not drive, use machinery, or do anything that needs mental alertness until you know how this medicine affects you. Do not stand or sit up quickly, especially if you are an older patient. This reduces the risk of dizzy or fainting spells. Call your doctor or health care professional for advice if you get a fever, chills or sore throat, or other symptoms of a cold or flu. Do not treat yourself. This drug decreases your body's ability to fight infections. Try to avoid being around people who are sick. This medicine may increase your risk to bruise or bleed. Call your doctor or health care professional if you notice any unusual bleeding. Be careful brushing and flossing your teeth or using a toothpick because you may get an infection or bleed more easily. If you have any dental work done, tell your dentist you are receiving this medicine. Avoid taking products that contain aspirin, acetaminophen, ibuprofen, naproxen, or ketoprofen unless instructed by your doctor. These medicines may hide a fever. Do not become pregnant while taking this medicine. Women should inform their doctor if they wish to become pregnant or think they might be pregnant. There is a potential for serious side effects to an unborn child. Talk to your health care professional or pharmacist for more information. Do not breast-feed an infant while taking this medicine. What side effects may I notice from receiving this medicine? Side effects that you should report to your doctor or health care professional as soon as possible: -allergic reactions like skin rash, itching or hives, swelling of the face, lips, or tongue -low blood counts - this medicine may decrease the number of white blood cells, red blood cells and platelets. You may be at increased risk for  infections and bleeding. -signs of infection - fever or chills, cough, sore throat, pain or difficulty passing urine -signs of decreased platelets or bleeding - bruising, pinpoint red spots on the skin, black, tarry stools, blood in the urine -signs of decreased red blood cells - unusually weak or tired, fainting spells, lightheadedness -breathing problems -chest pain -diarrhea -feeling faint or lightheaded, falls -flushing, runny nose, sweating during infusion -mouth sores or pain -pain, swelling, redness  or irritation where injected -pain, swelling, warmth in the leg -pain, tingling, numbness in the hands or feet -problems with balance, talking, walking -stomach cramps, pain -trouble passing urine or change in the amount of urine -vomiting as to be unable to hold down drinks or food -yellowing of the eyes or skin Side effects that usually do not require medical attention (report to your doctor or health care professional if they continue or are bothersome): -constipation -hair loss -headache -loss of appetite -nausea, vomiting -stomach upset This list may not describe all possible side effects. Call your doctor for medical advice about side effects. You may report side effects to FDA at 1-800-FDA-1088. Where should I keep my medicine? This drug is given in a hospital or clinic and will not be stored at home. NOTE: This sheet is a summary. It may not cover all possible information. If you have questions about this medicine, talk to your doctor, pharmacist, or health care provider.    2016, Elsevier/Gold Standard. (2012-12-04 16:29:32) Leucovorin injection What is this medicine? LEUCOVORIN (loo koe VOR in) is used to prevent or treat the harmful effects of some medicines. This medicine is used to treat anemia caused by a low amount of folic acid in the body. It is also used with 5-fluorouracil (5-FU) to treat colon cancer. This medicine may be used for other purposes; ask your  health care provider or pharmacist if you have questions. What should I tell my health care provider before I take this medicine? They need to know if you have any of these conditions: -anemia from low levels of vitamin B-12 in the blood -an unusual or allergic reaction to leucovorin, folic acid, other medicines, foods, dyes, or preservatives -pregnant or trying to get pregnant -breast-feeding How should I use this medicine? This medicine is for injection into a muscle or into a vein. It is given by a health care professional in a hospital or clinic setting. Talk to your pediatrician regarding the use of this medicine in children. Special care may be needed. Overdosage: If you think you have taken too much of this medicine contact a poison control center or emergency room at once. NOTE: This medicine is only for you. Do not share this medicine with others. What if I miss a dose? This does not apply. What may interact with this medicine? -capecitabine -fluorouracil -phenobarbital -phenytoin -primidone -trimethoprim-sulfamethoxazole This list may not describe all possible interactions. Give your health care provider a list of all the medicines, herbs, non-prescription drugs, or dietary supplements you use. Also tell them if you smoke, drink alcohol, or use illegal drugs. Some items may interact with your medicine. What should I watch for while using this medicine? Your condition will be monitored carefully while you are receiving this medicine. This medicine may increase the side effects of 5-fluorouracil, 5-FU. Tell your doctor or health care professional if you have diarrhea or mouth sores that do not get better or that get worse. What side effects may I notice from receiving this medicine? Side effects that you should report to your doctor or health care professional as soon as possible: -allergic reactions like skin rash, itching or hives, swelling of the face, lips, or  tongue -breathing problems -fever, infection -mouth sores -unusual bleeding or bruising -unusually weak or tired Side effects that usually do not require medical attention (report to your doctor or health care professional if they continue or are bothersome): -constipation or diarrhea -loss of appetite -nausea, vomiting This list may not  describe all possible side effects. Call your doctor for medical advice about side effects. You may report side effects to FDA at 1-800-FDA-1088. Where should I keep my medicine? This drug is given in a hospital or clinic and will not be stored at home. NOTE: This sheet is a summary. It may not cover all possible information. If you have questions about this medicine, talk to your doctor, pharmacist, or health care provider.    2016, Elsevier/Gold Standard. (2007-12-12 16:50:29) Fluorouracil, 5-FU injection What is this medicine? FLUOROURACIL, 5-FU (flure oh YOOR a sil) is a chemotherapy drug. It slows the growth of cancer cells. This medicine is used to treat many types of cancer like breast cancer, colon or rectal cancer, pancreatic cancer, and stomach cancer. This medicine may be used for other purposes; ask your health care provider or pharmacist if you have questions. What should I tell my health care provider before I take this medicine? They need to know if you have any of these conditions: -blood disorders -dihydropyrimidine dehydrogenase (DPD) deficiency -infection (especially a virus infection such as chickenpox, cold sores, or herpes) -kidney disease -liver disease -malnourished, poor nutrition -recent or ongoing radiation therapy -an unusual or allergic reaction to fluorouracil, other chemotherapy, other medicines, foods, dyes, or preservatives -pregnant or trying to get pregnant -breast-feeding How should I use this medicine? This drug is given as an infusion or injection into a vein. It is administered in a hospital or clinic by a  specially trained health care professional. Talk to your pediatrician regarding the use of this medicine in children. Special care may be needed. Overdosage: If you think you have taken too much of this medicine contact a poison control center or emergency room at once. NOTE: This medicine is only for you. Do not share this medicine with others. What if I miss a dose? It is important not to miss your dose. Call your doctor or health care professional if you are unable to keep an appointment. What may interact with this medicine? -allopurinol -cimetidine -dapsone -digoxin -hydroxyurea -leucovorin -levamisole -medicines for seizures like ethotoin, fosphenytoin, phenytoin -medicines to increase blood counts like filgrastim, pegfilgrastim, sargramostim -medicines that treat or prevent blood clots like warfarin, enoxaparin, and dalteparin -methotrexate -metronidazole -pyrimethamine -some other chemotherapy drugs like busulfan, cisplatin, estramustine, vinblastine -trimethoprim -trimetrexate -vaccines Talk to your doctor or health care professional before taking any of these medicines: -acetaminophen -aspirin -ibuprofen -ketoprofen -naproxen This list may not describe all possible interactions. Give your health care provider a list of all the medicines, herbs, non-prescription drugs, or dietary supplements you use. Also tell them if you smoke, drink alcohol, or use illegal drugs. Some items may interact with your medicine. What should I watch for while using this medicine? Visit your doctor for checks on your progress. This drug may make you feel generally unwell. This is not uncommon, as chemotherapy can affect healthy cells as well as cancer cells. Report any side effects. Continue your course of treatment even though you feel ill unless your doctor tells you to stop. In some cases, you may be given additional medicines to help with side effects. Follow all directions for their  use. Call your doctor or health care professional for advice if you get a fever, chills or sore throat, or other symptoms of a cold or flu. Do not treat yourself. This drug decreases your body's ability to fight infections. Try to avoid being around people who are sick. This medicine may increase your risk to bruise or  bleed. Call your doctor or health care professional if you notice any unusual bleeding. Be careful brushing and flossing your teeth or using a toothpick because you may get an infection or bleed more easily. If you have any dental work done, tell your dentist you are receiving this medicine. Avoid taking products that contain aspirin, acetaminophen, ibuprofen, naproxen, or ketoprofen unless instructed by your doctor. These medicines may hide a fever. Do not become pregnant while taking this medicine. Women should inform their doctor if they wish to become pregnant or think they might be pregnant. There is a potential for serious side effects to an unborn child. Talk to your health care professional or pharmacist for more information. Do not breast-feed an infant while taking this medicine. Men should inform their doctor if they wish to father a child. This medicine may lower sperm counts. Do not treat diarrhea with over the counter products. Contact your doctor if you have diarrhea that lasts more than 2 days or if it is severe and watery. This medicine can make you more sensitive to the sun. Keep out of the sun. If you cannot avoid being in the sun, wear protective clothing and use sunscreen. Do not use sun lamps or tanning beds/booths. What side effects may I notice from receiving this medicine? Side effects that you should report to your doctor or health care professional as soon as possible: -allergic reactions like skin rash, itching or hives, swelling of the face, lips, or tongue -low blood counts - this medicine may decrease the number of white blood cells, red blood cells and  platelets. You may be at increased risk for infections and bleeding. -signs of infection - fever or chills, cough, sore throat, pain or difficulty passing urine -signs of decreased platelets or bleeding - bruising, pinpoint red spots on the skin, black, tarry stools, blood in the urine -signs of decreased red blood cells - unusually weak or tired, fainting spells, lightheadedness -breathing problems -changes in vision -chest pain -mouth sores -nausea and vomiting -pain, swelling, redness at site where injected -pain, tingling, numbness in the hands or feet -redness, swelling, or sores on hands or feet -stomach pain -unusual bleeding Side effects that usually do not require medical attention (report to your doctor or health care professional if they continue or are bothersome): -changes in finger or toe nails -diarrhea -dry or itchy skin -hair loss -headache -loss of appetite -sensitivity of eyes to the light -stomach upset -unusually teary eyes This list may not describe all possible side effects. Call your doctor for medical advice about side effects. You may report side effects to FDA at 1-800-FDA-1088. Where should I keep my medicine? This drug is given in a hospital or clinic and will not be stored at home. NOTE: This sheet is a summary. It may not cover all possible information. If you have questions about this medicine, talk to your doctor, pharmacist, or health care provider.    2016, [employer] Standard. (2007-10-11 13:53:16) Pegfilgrastim injection What is this medicine? PEGFILGRASTIM (PEG fil gra stim) is a long-acting granulocyte colony-stimulating factor that stimulates the growth of neutrophils, a type of white blood cell important in the body's fight against infection. It is used to reduce the incidence of fever and infection in patients with certain types of cancer who are receiving chemotherapy that affects the bone marrow, and to increase survival after being  exposed to high doses of radiation. This medicine may be used for other purposes; ask your health care provider  or pharmacist if you have questions. What should I tell my health care provider before I take this medicine? They need to know if you have any of these conditions: -kidney disease -latex allergy -ongoing radiation therapy -sickle cell disease -skin reactions to acrylic adhesives (On-Body Injector only) -an unusual or allergic reaction to pegfilgrastim, filgrastim, other medicines, foods, dyes, or preservatives -pregnant or trying to get pregnant -breast-feeding How should I use this medicine? This medicine is for injection under the skin. If you get this medicine at home, you will be taught how to prepare and give the pre-filled syringe or how to use the On-body Injector. Refer to the patient Instructions for Use for detailed instructions. Use exactly as directed. Take your medicine at regular intervals. Do not take your medicine more often than directed. It is important that you put your used needles and syringes in a special sharps container. Do not put them in a trash can. If you do not have a sharps container, call your pharmacist or healthcare provider to get one. Talk to your pediatrician regarding the use of this medicine in children. While this drug may be prescribed for selected conditions, precautions do apply. Overdosage: If you think you have taken too much of this medicine contact a poison control center or emergency room at once. NOTE: This medicine is only for you. Do not share this medicine with others. What if I miss a dose? It is important not to miss your dose. Call your doctor or health care professional if you miss your dose. If you miss a dose due to an On-body Injector failure or leakage, a new dose should be administered as soon as possible using a single prefilled syringe for manual use. What may interact with this medicine? Interactions have not been  studied. Give your health care provider a list of all the medicines, herbs, non-prescription drugs, or dietary supplements you use. Also tell them if you smoke, drink alcohol, or use illegal drugs. Some items may interact with your medicine. This list may not describe all possible interactions. Give your health care provider a list of all the medicines, herbs, non-prescription drugs, or dietary supplements you use. Also tell them if you smoke, drink alcohol, or use illegal drugs. Some items may interact with your medicine. What should I watch for while using this medicine? You may need blood work done while you are taking this medicine. If you are going to need a MRI, CT scan, or other procedure, tell your doctor that you are using this medicine (On-Body Injector only). What side effects may I notice from receiving this medicine? Side effects that you should report to your doctor or health care professional as soon as possible: -allergic reactions like skin rash, itching or hives, swelling of the face, lips, or tongue -dizziness -fever -pain, redness, or irritation at site where injected -pinpoint red spots on the skin -red or dark-brown urine -shortness of breath or breathing problems -stomach or side pain, or pain at the shoulder -swelling -tiredness -trouble passing urine or change in the amount of urine Side effects that usually do not require medical attention (report to your doctor or health care professional if they continue or are bothersome): -bone pain -muscle pain This list may not describe all possible side effects. Call your doctor for medical advice about side effects. You may report side effects to FDA at 1-800-FDA-1088. Where should I keep my medicine? Keep out of the reach of children. Store pre-filled syringes in a refrigerator  between 2 and 8 degrees C (36 and 46 degrees F). Do not freeze. Keep in carton to protect from light. Throw away this medicine if it is left out of  the refrigerator for more than 48 hours. Throw away any unused medicine after the expiration date. NOTE: This sheet is a summary. It may not cover all possible information. If you have questions about this medicine, talk to your doctor, pharmacist, or health care provider.    2016, [employer] Standard. (2014-06-27 14:30:14) Palonosetron Injection What is this medicine? PALONOSETRON (pal oh NOE se tron) is used to prevent nausea and vomiting caused by chemotherapy. It also helps prevent delayed nausea and vomiting that may occur a few days after your treatment. This medicine may be used for other purposes; ask your health care provider or pharmacist if you have questions. What should I tell my health care provider before I take this medicine? They need to know if you have any of these conditions: -an unusual or allergic reaction to palonosetron, dolasetron, granisetron, ondansetron, other medicines, foods, dyes, or preservatives -pregnant or trying to get pregnant -breast-feeding How should I use this medicine? This medicine is for infusion into a vein. It is given by a health care professional in a hospital or clinic setting. Talk to your pediatrician regarding the use of this medicine in children. While this drug may be prescribed for children as young as 1 month for selected conditions, precautions do apply. Overdosage: If you think you have taken too much of this medicine contact a poison control center or emergency room at once. NOTE: This medicine is only for you. Do not share this medicine with others. What if I miss a dose? This does not apply. What may interact with this medicine? -certain medicines for depression, anxiety, or psychotic disturbances -fentanyl -linezolid -MAOIs like Carbex, Eldepryl, Marplan, Nardil, and Parnate -methylene blue (injected into a vein) -tramadol This list may not describe all possible interactions. Give your health care provider a list of all the  medicines, herbs, non-prescription drugs, or dietary supplements you use. Also tell them if you smoke, drink alcohol, or use illegal drugs. Some items may interact with your medicine. What should I watch for while using this medicine? Your condition will be monitored carefully while you are receiving this medicine. What side effects may I notice from receiving this medicine? Side effects that you should report to your doctor or health care professional as soon as possible: -allergic reactions like skin rash, itching or hives, swelling of the face, lips, or tongue -breathing problems -confusion -dizziness -fast, irregular heartbeat -fever and chills -loss of balance or coordination -seizures -sweating -swelling of the hands and feet -tremors -unusually weak or tired Side effects that usually do not require medical attention (report to your doctor or health care professional if they continue or are bothersome): -constipation or diarrhea -headache This list may not describe all possible side effects. Call your doctor for medical advice about side effects. You may report side effects to FDA at 1-800-FDA-1088. Where should I keep my medicine? This drug is given in a hospital or clinic and will not be stored at home. NOTE: This sheet is a summary. It may not cover all possible information. If you have questions about this medicine, talk to your doctor, pharmacist, or health care provider.    2016, [employer]/Gold Standard. (2013-04-13 10:38:36) Fosaprepitant injection What is this medicine? FOSAPREPITANT (fos ap RE pi tant) is used together with other medicines to prevent nausea and vomiting  caused by cancer treatment (chemotherapy). This medicine may be used for other purposes; ask your health care provider or pharmacist if you have questions. What should I tell my health care provider before I take this medicine? They need to know if you have any of these conditions: -liver disease -an  unusual or allergic reaction to fosaprepitant, aprepitant, medicines, foods, dyes, or preservatives -pregnant or trying to get pregnant -breast-feeding How should I use this medicine? This medicine is for injection into a vein. It is given by a health care professional in a hospital or clinic setting. Talk to your pediatrician regarding the use of this medicine in children. Special care may be needed. Overdosage: If you think you have taken too much of this medicine contact a poison control center or emergency room at once. NOTE: This medicine is only for you. Do not share this medicine with others. What if I miss a dose? This does not apply. What may interact with this medicine? Do not take this medicine with any of these medicines: -cisapride -flibanserin -lomitapide -pimozide This medicine may also interact with the following medications: -diltiazem -female hormones, like estrogens or progestins and birth control pills -medicines for fungal infections like ketoconazole and itraconazole -medicines for HIV -medicines for seizures or to control epilepsy like carbamazepine or phenytoin -medicines used for sleep or anxiety disorders like alprazolam, diazepam, or midazolam -nefazodone -paroxetine -ranolazine -rifampin -some chemotherapy medications like etoposide, ifosfamide, vinblastine, vincristine -some antibiotics like clarithromycin, erythromycin, troleandomycin -steroid medicines like dexamethasone or methylprednisolone -tolbutamide -warfarin This list may not describe all possible interactions. Give your health care provider a list of all the medicines, herbs, non-prescription drugs, or dietary supplements you use. Also tell them if you smoke, drink alcohol, or use illegal drugs. Some items may interact with your medicine. What should I watch for while using this medicine? Do not take this medicine if you already have nausea and vomiting. Ask your health care provider what to do  if you already have nausea. Birth control pills and other methods of hormonal contraception (for example, IUD or patch) may not work properly while you are taking this medicine. Use an extra method of birth control during treatment and for 1 month after your last dose of fosaprepitant. This medicine should not be used continuously for a long time. Visit your doctor or health care professional for regular check-ups. This medicine may change your liver function blood test results. What side effects may I notice from receiving this medicine? Side effects that you should report to your doctor or health care professional as soon as possible: -allergic reactions like skin rash, itching or hives, swelling of the face, lips, or tongue -breathing problems -changes in heart rhythm -high or low blood pressure -pain, redness, or irritation at site where injected -rectal bleeding -serious dizziness or disorientation, confusion -sharp or severe stomach pain -sharp pain in your leg Side effects that usually do not require medical attention (report to your doctor or health care professional if they continue or are bothersome): -constipation or diarrhea -hair loss -headache -hiccups -loss of appetite -nausea -upset stomach -tiredness This list may not describe all possible side effects. Call your doctor for medical advice about side effects. You may report side effects to FDA at 1-800-FDA-1088. Where should I keep my medicine? This drug is given in a hospital or clinic and will not be stored at home. NOTE: This sheet is a summary. It may not cover all possible information. If you have questions about this  medicine, talk to your doctor, pharmacist, or health care provider.    2016, [employer] Standard. (2014-07-24 10:45:34) Dexamethasone injection What is this medicine? DEXAMETHASONE (dex a METH a sone) is a corticosteroid. It is used to treat inflammation of the skin, joints, lungs, and other  organs. Common conditions treated include asthma, allergies, and arthritis. It is also used for other conditions, like blood disorders and diseases of the adrenal glands. This medicine may be used for other purposes; ask your health care provider or pharmacist if you have questions. What should I tell my health care provider before I take this medicine? They need to know if you have any of these conditions: -blood clotting problems -Cushing's syndrome -diabetes -glaucoma -heart problems or disease -high blood pressure -infection like herpes, measles, tuberculosis, or chickenpox -kidney disease -liver disease -mental problems -myasthenia gravis -osteoporosis -previous heart attack -seizures -stomach, ulcer or intestine disease including colitis and diverticulitis -thyroid problem -an unusual or allergic reaction to dexamethasone, corticosteroids, other medicines, lactose, foods, dyes, or preservatives -pregnant or trying to get pregnant -breast-feeding How should I use this medicine? This medicine is for injection into a muscle, joint, lesion, soft tissue, or vein. It is given by a health care professional in a hospital or clinic setting. Talk to your pediatrician regarding the use of this medicine in children. Special care may be needed. Overdosage: If you think you have taken too much of this medicine contact a poison control center or emergency room at once. NOTE: This medicine is only for you. Do not share this medicine with others. What if I miss a dose? This may not apply. If you are having a series of injections over a prolonged period, try not to miss an appointment. Call your doctor or health care professional to reschedule if you are unable to keep an appointment. What may interact with this medicine? Do not take this medicine with any of the following medications: -mifepristone, RU-486 -vaccines This medicine may also interact with the following medications: -amphotericin  B -antibiotics like clarithromycin, erythromycin, and troleandomycin -aspirin and aspirin-like drugs -barbiturates like phenobarbital -carbamazepine -cholestyramine -cholinesterase inhibitors like donepezil, galantamine, rivastigmine, and tacrine -cyclosporine -digoxin -diuretics -ephedrine -female hormones, like estrogens or progestins and birth control pills -indinavir -isoniazid -ketoconazole -medicines for diabetes -medicines that improve muscle tone or strength for conditions like myasthenia gravis -NSAIDs, medicines for pain and inflammation, like ibuprofen or naproxen -phenytoin -rifampin -thalidomide -warfarin This list may not describe all possible interactions. Give your health care provider a list of all the medicines, herbs, non-prescription drugs, or dietary supplements you use. Also tell them if you smoke, drink alcohol, or use illegal drugs. Some items may interact with your medicine. What should I watch for while using this medicine? Your condition will be monitored carefully while you are receiving this medicine. If you are taking this medicine for a long time, carry an identification card with your name and address, the type and dose of your medicine, and your doctor's name and address. This medicine may increase your risk of getting an infection. Stay away from people who are sick. Tell your doctor or health care professional if you are around anyone with measles or chickenpox. Talk to your health care provider before you get any vaccines that you take this medicine. If you are going to have surgery, tell your doctor or health care professional that you have taken this medicine within the last twelve months. Ask your doctor or health care professional about your diet. You may  need to lower the amount of salt you eat. The medicine can increase your blood sugar. If you are a diabetic check with your doctor if you need help adjusting the dose of your diabetic  medicine. What side effects may I notice from receiving this medicine? Side effects that you should report to your doctor or health care professional as soon as possible: -allergic reactions like skin rash, itching or hives, swelling of the face, lips, or tongue -black or tarry stools -change in the amount of urine -changes in vision -confusion, excitement, restlessness, a false sense of well-being -fever, sore throat, sneezing, cough, or other signs of infection, wounds that will not heal -hallucinations -increased thirst -mental depression, mood swings, mistaken feelings of self importance or of being mistreated -pain in hips, back, ribs, arms, shoulders, or legs -pain, redness, or irritation at the injection site -redness, blistering, peeling or loosening of the skin, including inside the mouth -rounding out of face -swelling of feet or lower legs -unusual bleeding or bruising -unusual tired or weak -wounds that do not heal Side effects that usually do not require medical attention (report to your doctor or health care professional if they continue or are bothersome): -diarrhea or constipation -change in taste -headache -nausea, vomiting -skin problems, acne, thin and shiny skin -touble sleeping -unusual growth of hair on the face or body -weight gain This list may not describe all possible side effects. Call your doctor for medical advice about side effects. You may report side effects to FDA at 1-800-FDA-1088. Where should I keep my medicine? This drug is given in a hospital or clinic and will not be stored at home. NOTE: This sheet is a summary. It may not cover all possible information. If you have questions about this medicine, talk to your doctor, pharmacist, or health care provider.    2016, [employer] Standard. (2007-09-28 14:04:12) Ondansetron injection What is this medicine? ONDANSETRON (on DAN se tron) is used to treat nausea and vomiting caused by chemotherapy.  It is also used to prevent or treat nausea and vomiting after surgery. This medicine may be used for other purposes; ask your health care provider or pharmacist if you have questions. What should I tell my health care provider before I take this medicine? They need to know if you have any of these conditions: -heart disease -history of irregular heartbeat -liver disease -low levels of magnesium or potassium in the blood -an unusual or allergic reaction to ondansetron, granisetron, other medicines, foods, dyes, or preservatives -pregnant or trying to get pregnant -breast-feeding How should I use this medicine? This medicine is for infusion into a vein. It is given by a health care professional in a hospital or clinic setting. Talk to your pediatrician regarding the use of this medicine in children. Special care may be needed. Overdosage: If you think you have taken too much of this medicine contact a poison control center or emergency room at once. NOTE: This medicine is only for you. Do not share this medicine with others. What if I miss a dose? This does not apply. What may interact with this medicine? Do not take this medicine with any of the following medications: -apomorphine -certain medicines for fungal infections like fluconazole, itraconazole, ketoconazole, posaconazole, voriconazole -cisapride -dofetilide -dronedarone -pimozide -thioridazine -ziprasidone This medicine may also interact with the following medications: -carbamazepine -certain medicines for depression, anxiety, or psychotic disturbances -fentanyl -linezolid -MAOIs like Carbex, Eldepryl, Marplan, Nardil, and Parnate -methylene blue (injected into a vein) -other medicines that  prolong the QT interval (cause an abnormal heart rhythm) -phenytoin -rifampicin -tramadol This list may not describe all possible interactions. Give your health care provider a list of all the medicines, herbs, non-prescription drugs,  or dietary supplements you use. Also tell them if you smoke, drink alcohol, or use illegal drugs. Some items may interact with your medicine. What should I watch for while using this medicine? Your condition will be monitored carefully while you are receiving this medicine. What side effects may I notice from receiving this medicine? Side effects that you should report to your doctor or health care professional as soon as possible: -allergic reactions like skin rash, itching or hives, swelling of the face, lips, or tongue -breathing problems -confusion -dizziness -fast or irregular heartbeat -feeling faint or lightheaded, falls -fever and chills -loss of balance or coordination -seizures -sweating -swelling of the hands and feet -tightness in the chest -tremors -unusually weak or tired Side effects that usually do not require medical attention (report to your doctor or health care professional if they continue or are bothersome): -constipation or diarrhea -headache This list may not describe all possible side effects. Call your doctor for medical advice about side effects. You may report side effects to FDA at 1-800-FDA-1088. Where should I keep my medicine? This drug is given in a hospital or clinic and will not be stored at home. NOTE: This sheet is a summary. It may not cover all possible information. If you have questions about this medicine, talk to your doctor, pharmacist, or health care provider.    2016, [employer] Standard. (2013-03-14 16:18:28) Prochlorperazine tablets What is this medicine? PROCHLORPERAZINE (proe klor PER a zeen) helps to control severe nausea and vomiting. This medicine is also used to treat schizophrenia. It can also help patients who experience anxiety that is not due to psychological illness. This medicine may be used for other purposes; ask your health care provider or pharmacist if you have questions. What should I tell my health care provider  before I take this medicine? They need to know if you have any of these conditions: -blood disorders or disease -dementia -liver disease or jaundice -Parkinson's disease -uncontrollable movement disorder -an unusual or allergic reaction to prochlorperazine, other medicines, foods, dyes, or preservatives -pregnant or trying to get pregnant -breast-feeding How should I use this medicine? Take this medicine by mouth with a glass of water. Follow the directions on the prescription label. Take your doses at regular intervals. Do not take your medicine more often than directed. Do not stop taking this medicine suddenly. This can cause nausea, vomiting, and dizziness. Ask your doctor or health care professional for advice. Talk to your pediatrician regarding the use of this medicine in children. Special care may be needed. While this drug may be prescribed for children as young as 2 years for selected conditions, precautions do apply. Overdosage: If you think you have taken too much of this medicine contact a poison control center or emergency room at once. NOTE: This medicine is only for you. Do not share this medicine with others. What if I miss a dose? If you miss a dose, take it as soon as you can. If it is almost time for your next dose, take only that dose. Do not take double or extra doses. What may interact with this medicine? Do not take this medicine with any of the following medications: -amoxapine -antidepressants like citalopram, escitalopram, fluoxetine, paroxetine, and sertraline -deferoxamine -dofetilide -maprotiline -tricyclic antidepressants like amitriptyline, clomipramine, imipramine, nortiptyline  and others This medicine may also interact with the following medications: -lithium -medicines for pain -phenytoin -propranolol -warfarin This list may not describe all possible interactions. Give your health care provider a list of all the medicines, herbs, non-prescription  drugs, or dietary supplements you use. Also tell them if you smoke, drink alcohol, or use illegal drugs. Some items may interact with your medicine. What should I watch for while using this medicine? Visit your doctor or health care professional for regular checks on your progress. You may get drowsy or dizzy. Do not drive, use machinery, or do anything that needs mental alertness until you know how this medicine affects you. Do not stand or sit up quickly, especially if you are an older patient. This reduces the risk of dizzy or fainting spells. Alcohol may interfere with the effect of this medicine. Avoid alcoholic drinks. This medicine can reduce the response of your body to heat or cold. Dress warm in cold weather and stay hydrated in hot weather. If possible, avoid extreme temperatures like saunas, hot tubs, very hot or cold showers, or activities that can cause dehydration such as vigorous exercise. This medicine can make you more sensitive to the sun. Keep out of the sun. If you cannot avoid being in the sun, wear protective clothing and use sunscreen. Do not use sun lamps or tanning beds/booths. Your mouth may get dry. Chewing sugarless gum or sucking hard candy, and drinking plenty of water may help. Contact your doctor if the problem does not go away or is severe. What side effects may I notice from receiving this medicine? Side effects that you should report to your doctor or health care professional as soon as possible: -blurred vision -breast enlargement in men or women -breast milk in women who are not breast-feeding -chest pain, fast or irregular heartbeat -confusion, restlessness -dark yellow or brown urine -difficulty breathing or swallowing -dizziness or fainting spells -drooling, shaking, movement difficulty (shuffling walk) or rigidity -fever, chills, sore throat -involuntary or uncontrollable movements of the eyes, mouth, head, arms, and legs -seizures -stomach area  pain -unusually weak or tired -unusual bleeding or bruising -yellowing of skin or eyes Side effects that usually do not require medical attention (report to your doctor or health care professional if they continue or are bothersome): -difficulty passing urine -difficulty sleeping -headache -sexual dysfunction -skin rash, or itching This list may not describe all possible side effects. Call your doctor for medical advice about side effects. You may report side effects to FDA at 1-800-FDA-1088. Where should I keep my medicine? Keep out of the reach of children. Store at room temperature between 15 and 30 degrees C (59 and 86 degrees F). Protect from light. Throw away any unused medicine after the expiration date. NOTE: This sheet is a summary. It may not cover all possible information. If you have questions about this medicine, talk to your doctor, pharmacist, or health care provider.    2016, Elsevier/Gold Standard. (2011-10-26 16:59:39) Ondansetron tablets What is this medicine? ONDANSETRON (on DAN se tron) is used to treat nausea and vomiting caused by chemotherapy. It is also used to prevent or treat nausea and vomiting after surgery. This medicine may be used for other purposes; ask your health care provider or pharmacist if you have questions. What should I tell my health care provider before I take this medicine? They need to know if you have any of these conditions: -heart disease -history of irregular heartbeat -liver disease -low levels of magnesium or  potassium in the blood -an unusual or allergic reaction to ondansetron, granisetron, other medicines, foods, dyes, or preservatives -pregnant or trying to get pregnant -breast-feeding How should I use this medicine? Take this medicine by mouth with a glass of water. Follow the directions on your prescription label. Take your doses at regular intervals. Do not take your medicine more often than directed. Talk to your  pediatrician regarding the use of this medicine in children. Special care may be needed. Overdosage: If you think you have taken too much of this medicine contact a poison control center or emergency room at once. NOTE: This medicine is only for you. Do not share this medicine with others. What if I miss a dose? If you miss a dose, take it as soon as you can. If it is almost time for your next dose, take only that dose. Do not take double or extra doses. What may interact with this medicine? Do not take this medicine with any of the following medications: -apomorphine -certain medicines for fungal infections like fluconazole, itraconazole, ketoconazole, posaconazole, voriconazole -cisapride -dofetilide -dronedarone -pimozide -thioridazine -ziprasidone This medicine may also interact with the following medications: -carbamazepine -certain medicines for depression, anxiety, or psychotic disturbances -fentanyl -linezolid -MAOIs like Carbex, Eldepryl, Marplan, Nardil, and Parnate -methylene blue (injected into a vein) -other medicines that prolong the QT interval (cause an abnormal heart rhythm) -phenytoin -rifampicin -tramadol This list may not describe all possible interactions. Give your health care provider a list of all the medicines, herbs, non-prescription drugs, or dietary supplements you use. Also tell them if you smoke, drink alcohol, or use illegal drugs. Some items may interact with your medicine. What should I watch for while using this medicine? Check with your doctor or health care professional right away if you have any sign of an allergic reaction. What side effects may I notice from receiving this medicine? Side effects that you should report to your doctor or health care professional as soon as possible: -allergic reactions like skin rash, itching or hives, swelling of the face, lips or tongue -breathing problems -confusion -dizziness -fast or irregular  heartbeat -feeling faint or lightheaded, falls -fever and chills -loss of balance or coordination -seizures -sweating -swelling of the hands or feet -tightness in the chest -tremors -unusually weak or tired Side effects that usually do not require medical attention (report to your doctor or health care professional if they continue or are bothersome): -constipation or diarrhea -headache This list may not describe all possible side effects. Call your doctor for medical advice about side effects. You may report side effects to FDA at 1-800-FDA-1088. Where should I keep my medicine? Keep out of the reach of children. Store between 2 and 30 degrees C (36 and 86 degrees F). Throw away any unused medicine after the expiration date. NOTE: This sheet is a summary. It may not cover all possible information. If you have questions about this medicine, talk to your doctor, pharmacist, or health care provider.    2016, Elsevier/Gold Standard. (2013-03-14 16:27:45)

## 2015-09-02 ENCOUNTER — Encounter (HOSPITAL_BASED_OUTPATIENT_CLINIC_OR_DEPARTMENT_OTHER): Payer: BLUE CROSS/BLUE SHIELD

## 2015-09-02 ENCOUNTER — Encounter: Payer: Self-pay | Admitting: Dietician

## 2015-09-02 ENCOUNTER — Other Ambulatory Visit (HOSPITAL_COMMUNITY): Payer: Self-pay | Admitting: Oncology

## 2015-09-02 ENCOUNTER — Encounter (HOSPITAL_BASED_OUTPATIENT_CLINIC_OR_DEPARTMENT_OTHER): Payer: BLUE CROSS/BLUE SHIELD | Admitting: Oncology

## 2015-09-02 ENCOUNTER — Encounter (HOSPITAL_COMMUNITY): Payer: Self-pay | Admitting: Oncology

## 2015-09-02 ENCOUNTER — Encounter (HOSPITAL_COMMUNITY): Payer: BLUE CROSS/BLUE SHIELD

## 2015-09-02 ENCOUNTER — Encounter: Payer: Self-pay | Admitting: *Deleted

## 2015-09-02 ENCOUNTER — Telehealth (HOSPITAL_COMMUNITY): Payer: Self-pay | Admitting: *Deleted

## 2015-09-02 VITALS — BP 124/50 | HR 72 | Temp 98.4°F | Resp 16 | Ht 62.0 in | Wt 111.4 lb

## 2015-09-02 VITALS — BP 109/57 | HR 76 | Temp 98.6°F | Resp 16 | Wt 111.4 lb

## 2015-09-02 DIAGNOSIS — C786 Secondary malignant neoplasm of retroperitoneum and peritoneum: Secondary | ICD-10-CM

## 2015-09-02 DIAGNOSIS — Z5111 Encounter for antineoplastic chemotherapy: Secondary | ICD-10-CM

## 2015-09-02 DIAGNOSIS — C189 Malignant neoplasm of colon, unspecified: Secondary | ICD-10-CM | POA: Diagnosis not present

## 2015-09-02 DIAGNOSIS — C801 Malignant (primary) neoplasm, unspecified: Secondary | ICD-10-CM

## 2015-09-02 LAB — CBC WITH DIFFERENTIAL/PLATELET
BASOS ABS: 0 10*3/uL (ref 0.0–0.1)
Basophils Relative: 0 %
Eosinophils Absolute: 0 10*3/uL (ref 0.0–0.7)
Eosinophils Relative: 0 %
HEMATOCRIT: 34.9 % — AB (ref 36.0–46.0)
Hemoglobin: 11.2 g/dL — ABNORMAL LOW (ref 12.0–15.0)
LYMPHS ABS: 0.6 10*3/uL — AB (ref 0.7–4.0)
LYMPHS PCT: 6 %
MCH: 27.1 pg (ref 26.0–34.0)
MCHC: 32.1 g/dL (ref 30.0–36.0)
MCV: 84.5 fL (ref 78.0–100.0)
MONO ABS: 1 10*3/uL (ref 0.1–1.0)
MONOS PCT: 9 %
NEUTROS ABS: 9 10*3/uL — AB (ref 1.7–7.7)
Neutrophils Relative %: 85 %
Platelets: 316 10*3/uL (ref 150–400)
RBC: 4.13 MIL/uL (ref 3.87–5.11)
RDW: 13.9 % (ref 11.5–15.5)
WBC: 10.6 10*3/uL — ABNORMAL HIGH (ref 4.0–10.5)

## 2015-09-02 LAB — COMPREHENSIVE METABOLIC PANEL
ALT: 14 U/L (ref 14–54)
AST: 10 U/L — ABNORMAL LOW (ref 15–41)
Albumin: 2.7 g/dL — ABNORMAL LOW (ref 3.5–5.0)
Alkaline Phosphatase: 56 U/L (ref 38–126)
Anion gap: 8 (ref 5–15)
BUN: 12 mg/dL (ref 6–20)
CHLORIDE: 99 mmol/L — AB (ref 101–111)
CO2: 27 mmol/L (ref 22–32)
CREATININE: 0.82 mg/dL (ref 0.44–1.00)
Calcium: 8.4 mg/dL — ABNORMAL LOW (ref 8.9–10.3)
GFR calc non Af Amer: >60 mL/min (ref >60)
Glucose, Bld: 118 mg/dL — ABNORMAL HIGH (ref 65–99)
Potassium: 4 mmol/L (ref 3.5–5.1)
SODIUM: 134 mmol/L — AB (ref 135–145)
Total Bilirubin: 0.9 mg/dL (ref 0.3–1.2)
Total Protein: 6.4 g/dL — ABNORMAL LOW (ref 6.5–8.1)

## 2015-09-02 LAB — MAGNESIUM: Magnesium: 1.7 mg/dL (ref 1.7–2.4)

## 2015-09-02 MED ORDER — SODIUM CHLORIDE 0.9% FLUSH
10.0000 mL | INTRAVENOUS | Status: DC | PRN
  Administered 2015-09-02: 10 mL
  Filled 2015-09-02: qty 10

## 2015-09-02 MED ORDER — SODIUM CHLORIDE 0.9 % IV SOLN
2400.0000 mg/m2 | INTRAVENOUS | Status: AC
  Administered 2015-09-02: 3600 mg via INTRAVENOUS
  Filled 2015-09-02: qty 72

## 2015-09-02 MED ORDER — DEXTROSE 5 % IV SOLN
400.0000 mg/m2 | Freq: Once | INTRAVENOUS | Status: AC
  Administered 2015-09-02: 604 mg via INTRAVENOUS
  Filled 2015-09-02: qty 30.2

## 2015-09-02 MED ORDER — DIPHENOXYLATE-ATROPINE 2.5-0.025 MG PO TABS: ORAL_TABLET | ORAL | Status: AC

## 2015-09-02 MED ORDER — SODIUM CHLORIDE 0.9 % IV SOLN
Freq: Once | INTRAVENOUS | Status: AC
  Administered 2015-09-02: 8 mg via INTRAVENOUS
  Filled 2015-09-02: qty 4

## 2015-09-02 MED ORDER — SODIUM CHLORIDE 0.9 % IV SOLN
8.0000 mg | Freq: Once | INTRAVENOUS | Status: DC
  Filled 2015-09-02: qty 4

## 2015-09-02 MED ORDER — SODIUM CHLORIDE 0.9 % IV SOLN: 10.0000 mg | Freq: Once | INTRAVENOUS | Status: DC

## 2015-09-02 MED ORDER — IRINOTECAN HCL CHEMO INJECTION 100 MG/5ML
135.0000 mg/m2 | Freq: Once | INTRAVENOUS | Status: AC
  Administered 2015-09-02: 204 mg via INTRAVENOUS
  Filled 2015-09-02: qty 10.2

## 2015-09-02 MED ORDER — SODIUM CHLORIDE 0.9 % IV SOLN
Freq: Once | INTRAVENOUS | Status: AC
  Administered 2015-09-02: 10:00:00 via INTRAVENOUS

## 2015-09-02 NOTE — Patient Instructions (Signed)
[facility] Discharge Instructions for Patients Receiving Chemotherapy   Beginning January 23rd 2017 lab work for the [facility] will be done in the  Main lab at [facility] on 1st floor. If you have a lab appointment with the [facility] please come in thru the  Main Entrance and check in at the main information desk   Today you received the following chemotherapy agents Irinotecan, Leucovorin and 5FU pump.  To help prevent nausea and vomiting after your treatment, we encourage you to take your nausea medication as instructed. If you develop nausea and vomiting, or diarrhea that is not controlled by your medication, call the clinic.  The clinic phone number is [phone]. Office hours are Monday-Friday 8:30am-5:00pm.  BELOW ARE SYMPTOMS THAT SHOULD BE REPORTED IMMEDIATELY:  *FEVER GREATER THAN 101.0 F  *CHILLS WITH OR WITHOUT FEVER  NAUSEA AND VOMITING THAT IS NOT CONTROLLED WITH YOUR NAUSEA MEDICATION  *UNUSUAL SHORTNESS OF BREATH  *UNUSUAL BRUISING OR BLEEDING  TENDERNESS IN MOUTH AND THROAT WITH OR WITHOUT PRESENCE OF ULCERS  *URINARY PROBLEMS  *BOWEL PROBLEMS  UNUSUAL RASH Items with * indicate a potential emergency and should be followed up as soon as possible. If you have an emergency after office hours please contact your primary care physician or go to the nearest emergency department.  Please call the clinic during office hours if you have any questions or concerns.   You may also contact the Patient Navigator at [phone] should you have any questions or need assistance in obtaining follow up care.  Resources For Cancer Patients and their Caregivers ? American Cancer Society: Can assist with transportation, wigs, general needs, runs Look Good Feel Better.        743-887-1345 ? Cancer Care: Provides financial assistance, online support groups, medication/co-pay assistance.  1-800-813-HOPE 269-623-6546) ? [place] Assists [place] cancer patients and their families through emotional , educational and financial support.  217-044-9288 ? [place] DSS Where to apply for food stamps, Medicaid and utility assistance. 819-096-5966 ? RCATS: Transportation to medical appointments. 650-312-6302 ? Social Security Administration: May apply for disability if have a Stage IV cancer. [phone] ? [employer], Disability and Transit Services: Assists with nutrition, care and transit needs. [phone]

## 2015-09-02 NOTE — Progress Notes (Signed)
Asked to assess pt by oncology nursing staff due to new [place] IV patient.   Contacted Pt by visiting during chemo   Wt Readings from Last 10 Encounters:  09/02/15 111 lb 6.4 oz (50.531 kg)  09/02/15 111 lb 6.4 oz (50.531 kg)  08/21/15 115 lb (52.164 kg)  08/14/15 117 lb 3.2 oz (53.162 kg)  08/07/15 117 lb (53.071 kg)  11/21/13 146 lb (66.225 kg)  11/21/13 147 lb (66.679 kg)   Patient has lost ~5 lbs in the last month. She has lost 35 lbs in last 1.75 years.   Patient reports oral intake fair. She reports that she is somewhat scared to eat due to fear that she will develop a bowel obstruction. Pt states this has happened in the past, though not recently.  She says she tries to eat liquids and softer foods for the same reason.  She is suffering from severe constipation and says that certain foods aggravate it. She says there is a "blockage" where she had part of her colon resected and believes this is the cause of her constipation. Certain foods apparently pass this area more easily than others. She says she can't eat meats for this reason.   Her dietary habits are already fairly in line with one to reduce constipation; she drinks plenty of water and she eats soft foods and liquids, though it does not sound like she eats much fiber. Pt has been taking miralax and colace, but says the miralax does not help. Her friends in the room report that her bowel regimen was changed today to help her better manage the constipation  Discussed with pt and friends an ideal diet for wt gain. RD went over high protein/calorie foods other than meat: cheese, PB, Egg, legumes. We discussed the benefit of oral nutrition supplements such as boost/ensure. Went over when a clear version would be better than the regular version.   Pt reports that her goal wt is 134 lbs. Weight gain goal is 1-2 lbs each week. Gave calorie intake recommendation of 2000 kcals/day to support this rate of wt gain.   Pt seemed to be accepting  of the information. Her friends in the room seem to be very invovled in her care and pt would appear to have strong support.   Will follow as she starts therapy.   Left my contact info, coupons, and handouts titled "Increasing Calories and Protein"  Burtis Junes RD, LDN Nutrition Pager: [phone] 09/02/2015 1:30 PM

## 2015-09-02 NOTE — Telephone Encounter (Signed)
Request sent on Charlene Brooke for PDL1 for [place].

## 2015-09-02 NOTE — Progress Notes (Unsigned)
Tolerated chemo well. Continuous infusion pump intact. Stable on discharge home with sister via wheelchair.

## 2015-09-02 NOTE — Patient Instructions (Addendum)
[place] at [facility] Discharge Instructions  RECOMMENDATIONS MADE BY THE CONSULTANT AND ANY TEST RESULTS WILL BE SENT TO YOUR REFERRING PHYSICIAN.  Exam and discussion today with Kirby Crigler, PA. Follow instructions on constipation sheet. Chemotherapy today as planned. Return in one week for lab work (nadir check) and follow-up visit. Return in 2 weeks for chemotherapy and office visit.    Thank you for choosing [place] at [facility] to provide your oncology and hematology care.  To afford each patient quality time with our provider, please arrive at least 15 minutes before your scheduled appointment time.   Beginning January 23rd 2017 lab work for the [name] will be done in the  Main lab at [facility] on 1st floor. If you have a lab appointment with the [facility] please come in thru the  Main Entrance and check in at the main information desk  You need to re-schedule your appointment should you arrive 10 or more minutes late.  We strive to give you quality time with our providers, and arriving late affects you and other patients whose appointments are after yours.  Also, if you no show three or more times for appointments you may be dismissed from the clinic at the providers discretion.     Again, thank you for choosing [facility].  Our hope is that these requests will decrease the amount of time that you wait before being seen by our physicians.       _____________________________________________________________  Should you have questions after your visit to [facility], please contact our office at [phone] between the hours of 8:30 a.m. and 4:30 p.m.  Voicemails left after 4:30 p.m. will not be returned until the following business day.  For prescription refill requests, have your pharmacy contact our office.         Resources For Cancer Patients and their Caregivers ? American Cancer  Society: Can assist with transportation, wigs, general needs, runs Look Good Feel Better.        838-598-2484 ? Cancer Care: Provides financial assistance, online support groups, medication/co-pay assistance.  1-800-813-HOPE 831-542-2121) ? [place] Assists [place] cancer patients and their families through emotional , educational and financial support.  952-293-7684 ? [place] DSS Where to apply for food stamps, Medicaid and utility assistance. [phone] ? RCATS: Transportation to medical appointments. [phone] ? Social Security Administration: May apply for disability if have a Stage IV cancer. [phone] ? [employer], Disability and Transit Services: Assists with nutrition, care and transit needs. [phone]

## 2015-09-02 NOTE — Progress Notes (Signed)
Chemo teaching done and consent signed for Irinotecan, Leucovorin, 5FU. Distress screening done. Calendar given to patient. [place] called into [facility] and I instructed them to fill the Compazine that was previously called in.

## 2015-09-02 NOTE — Progress Notes (Signed)
[facility] Psychosocial Distress Screening Clinical Social Work  Clinical Social Work was referred by distress screening protocol.  The patient scored a 6 on the Psychosocial Distress Thermometer which indicates moderate distress. Clinical Social Worker met with pt and her two sisters during pt's treatment to assess for distress and other psychosocial needs. Pt shared she had reviewed distress screen concerns with RN, [name]. CSW reviewed ADRs and pt was not ready to proceed today with completion. Pt plans to take home and review. CSW reviewed resources and provided list and contact info. Pt agrees to reach out as needed and CSW can follow up at future appointments.   ONCBCN DISTRESS SCREENING 09/02/2015  Screening Type Initial Screening  Distress experienced in past week (1-10) 6  Emotional problem type Adjusting to illness;Nervousness/Anxiety;Adjusting to appearance changes  Spiritual/Religous concerns type Loss of sense of purpose  Physical Problem type Pain;Loss of appetitie;Constipation/diarrhea;Skin dry/itchy  Referral to clinical social work Yes  Referral to dietition Yes    Clinical Social Worker follow up needed: Yes.    If yes, follow up plan:Grier Alea Ryer, [place] Tuesdays   [phone]

## 2015-09-03 ENCOUNTER — Telehealth (HOSPITAL_COMMUNITY): Payer: Self-pay | Admitting: *Deleted

## 2015-09-03 LAB — CEA: CEA: 19.5 ng/mL — ABNORMAL HIGH (ref 0.0–4.7)

## 2015-09-04 ENCOUNTER — Encounter (HOSPITAL_BASED_OUTPATIENT_CLINIC_OR_DEPARTMENT_OTHER): Payer: BLUE CROSS/BLUE SHIELD

## 2015-09-04 VITALS — BP 143/64 | HR 97 | Temp 98.1°F | Resp 18

## 2015-09-04 DIAGNOSIS — C801 Malignant (primary) neoplasm, unspecified: Principal | ICD-10-CM

## 2015-09-04 DIAGNOSIS — C189 Malignant neoplasm of colon, unspecified: Secondary | ICD-10-CM | POA: Diagnosis not present

## 2015-09-04 DIAGNOSIS — C786 Secondary malignant neoplasm of retroperitoneum and peritoneum: Secondary | ICD-10-CM | POA: Diagnosis not present

## 2015-09-04 DIAGNOSIS — Z5189 Encounter for other specified aftercare: Secondary | ICD-10-CM | POA: Diagnosis not present

## 2015-09-04 MED ORDER — SODIUM CHLORIDE 0.9% FLUSH
10.0000 mL | INTRAVENOUS | Status: DC | PRN
  Administered 2015-09-04: 10 mL
  Filled 2015-09-04: qty 10

## 2015-09-04 MED ORDER — PEGFILGRASTIM 6 MG/0.6ML ~~LOC~~ PSKT
PREFILLED_SYRINGE | SUBCUTANEOUS | Status: AC
Start: 2015-09-04 — End: 2015-09-04
  Filled 2015-09-04: qty 0.6

## 2015-09-04 MED ORDER — HEPARIN SOD (PORK) LOCK FLUSH 100 UNIT/ML IV SOLN
500.0000 [IU] | Freq: Once | INTRAVENOUS | Status: AC | PRN
  Administered 2015-09-04: 500 [IU]

## 2015-09-04 MED ORDER — HEPARIN SOD (PORK) LOCK FLUSH 100 UNIT/ML IV SOLN
INTRAVENOUS | Status: AC
  Filled 2015-09-04: qty 5

## 2015-09-04 MED ORDER — PEGFILGRASTIM 6 MG/0.6ML ~~LOC~~ PSKT
6.0000 mg | PREFILLED_SYRINGE | Freq: Once | SUBCUTANEOUS | Status: AC
  Administered 2015-09-04: 6 mg via SUBCUTANEOUS

## 2015-09-04 NOTE — Patient Instructions (Signed)
[place] at [facility] Discharge Instructions  RECOMMENDATIONS MADE BY THE CONSULTANT AND ANY TEST RESULTS WILL BE SENT TO YOUR REFERRING PHYSICIAN.  Pump removal and port flush.   Neulasta OnPro applied to right arm, remove at 8pm tomorrow night.    Thank you for choosing [place] at [facility] to provide your oncology and hematology care.  To afford each patient quality time with our provider, please arrive at least 15 minutes before your scheduled appointment time.   Beginning January 23rd 2017 lab work for the [name] will be done in the  Main lab at [facility] on 1st floor. If you have a lab appointment with the [facility] please come in thru the  Main Entrance and check in at the main information desk  You need to re-schedule your appointment should you arrive 10 or more minutes late.  We strive to give you quality time with our providers, and arriving late affects you and other patients whose appointments are after yours.  Also, if you no show three or more times for appointments you may be dismissed from the clinic at the providers discretion.     Again, thank you for choosing [facility].  Our hope is that these requests will decrease the amount of time that you wait before being seen by our physicians.       _____________________________________________________________  Should you have questions after your visit to [facility], please contact our office at [phone] between the hours of 8:30 a.m. and 4:30 p.m.  Voicemails left after 4:30 p.m. will not be returned until the following business day.  For prescription refill requests, have your pharmacy contact our office.         Resources For Cancer Patients and their Caregivers ? American Cancer Society: Can assist with transportation, wigs, general needs, runs Look Good Feel Better.        279-525-5749 ? Cancer Care: Provides  financial assistance, online support groups, medication/co-pay assistance.  1-800-813-HOPE 442-073-1475) ? [place] Assists [place] cancer patients and their families through emotional , educational and financial support.  406-091-1145 ? [place] DSS Where to apply for food stamps, Medicaid and utility assistance. (575)380-8617 ? RCATS: Transportation to medical appointments. [phone] ? Social Security Administration: May apply for disability if have a Stage IV cancer. [phone] 5147872291 ? [employer], Disability and Transit Services: Assists with nutrition, care and transit needs. [phone]

## 2015-09-04 NOTE — Progress Notes (Signed)
Julie Galvan presented for home infusion pump removal and Portacath flush. Portacath located left chest wall. Good blood return present. Portacath flushed with 44ml NS and 500U/49ml Heparin and needle removed intact. Procedure without incident. Patient tolerated procedure well.  Neulasta OnPro applied to right upper arm.

## 2015-09-04 NOTE — Telephone Encounter (Signed)
Called to home phone no answer. Called husbands cell phone and person states it is the wrong number. Will follow up with patient tomorrow when here for pump removal.

## 2015-09-10 NOTE — Assessment & Plan Note (Deleted)
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  Oncology history is up to date.  She has seen Dr. Johney Galvan at [facility] for surgical oncology opinion:  ASSESSMENT/PLAN: Julie Galvan is a 65 y.o. female who presents today to the [facility] for the first time for an assessment of peritoneal carcinomatosis with colon adenocarcinoma primary. We discussed the implications of this diagnosis at length and in detail. We discussed that as her disease is not contained to the peritoneum, she is not a candidate for HIPEC at this time. We recommend to continue chemotherapy for this but should avoid any further radiation therapy. She will follow up in clinic as needed.  Pre-treatment labs as ordered next week: CBC diff, CMET.  Return next week for follow-up, pre-treatment labs, and next cycle of FOLFIRI.

## 2015-09-10 NOTE — Progress Notes (Signed)
This encounter was created in error - please disregard.

## 2015-09-11 ENCOUNTER — Encounter (HOSPITAL_COMMUNITY): Payer: Self-pay

## 2015-09-11 ENCOUNTER — Encounter (HOSPITAL_BASED_OUTPATIENT_CLINIC_OR_DEPARTMENT_OTHER): Payer: BLUE CROSS/BLUE SHIELD

## 2015-09-11 ENCOUNTER — Encounter (HOSPITAL_BASED_OUTPATIENT_CLINIC_OR_DEPARTMENT_OTHER): Payer: BLUE CROSS/BLUE SHIELD | Admitting: Oncology

## 2015-09-11 ENCOUNTER — Other Ambulatory Visit (HOSPITAL_COMMUNITY): Payer: Self-pay | Admitting: Oncology

## 2015-09-11 ENCOUNTER — Other Ambulatory Visit (HOSPITAL_COMMUNITY): Payer: BLUE CROSS/BLUE SHIELD

## 2015-09-11 ENCOUNTER — Ambulatory Visit (HOSPITAL_COMMUNITY): Payer: BLUE CROSS/BLUE SHIELD | Admitting: Oncology

## 2015-09-11 VITALS — BP 133/52 | HR 77 | Temp 98.3°F | Resp 16

## 2015-09-11 DIAGNOSIS — C189 Malignant neoplasm of colon, unspecified: Secondary | ICD-10-CM

## 2015-09-11 DIAGNOSIS — R112 Nausea with vomiting, unspecified: Secondary | ICD-10-CM

## 2015-09-11 DIAGNOSIS — Z95828 Presence of other vascular implants and grafts: Secondary | ICD-10-CM

## 2015-09-11 DIAGNOSIS — E876 Hypokalemia: Secondary | ICD-10-CM

## 2015-09-11 LAB — CBC WITH DIFFERENTIAL/PLATELET
BASOS ABS: 0 10*3/uL (ref 0.0–0.1)
BASOS PCT: 0 %
EOS PCT: 0 %
Eosinophils Absolute: 0 10*3/uL (ref 0.0–0.7)
HCT: 31 % — ABNORMAL LOW (ref 36.0–46.0)
Hemoglobin: 10.2 g/dL — ABNORMAL LOW (ref 12.0–15.0)
Lymphocytes Relative: 14 %
Lymphs Abs: 0.7 10*3/uL (ref 0.7–4.0)
MCH: 27.4 pg (ref 26.0–34.0)
MCHC: 32.9 g/dL (ref 30.0–36.0)
MCV: 83.3 fL (ref 78.0–100.0)
MONO ABS: 1 10*3/uL (ref 0.1–1.0)
Monocytes Relative: 20 %
NEUTROS ABS: 3.2 10*3/uL (ref 1.7–7.7)
Neutrophils Relative %: 66 %
PLATELETS: 148 10*3/uL — AB (ref 150–400)
RBC: 3.72 MIL/uL — ABNORMAL LOW (ref 3.87–5.11)
RDW: 13.8 % (ref 11.5–15.5)
WBC: 4.9 10*3/uL (ref 4.0–10.5)

## 2015-09-11 LAB — COMPREHENSIVE METABOLIC PANEL
ALBUMIN: 2.7 g/dL — AB (ref 3.5–5.0)
ALT: 8 U/L — ABNORMAL LOW (ref 14–54)
AST: 8 U/L — AB (ref 15–41)
Alkaline Phosphatase: 55 U/L (ref 38–126)
Anion gap: 10 (ref 5–15)
BUN: 10 mg/dL (ref 6–20)
CHLORIDE: 96 mmol/L — AB (ref 101–111)
CO2: 32 mmol/L (ref 22–32)
Calcium: 8.4 mg/dL — ABNORMAL LOW (ref 8.9–10.3)
Creatinine, Ser: 0.73 mg/dL (ref 0.44–1.00)
GFR calc Af Amer: >60 mL/min (ref >60)
GFR calc non Af Amer: >60 mL/min (ref >60)
GLUCOSE: 120 mg/dL — AB (ref 65–99)
POTASSIUM: 3 mmol/L — AB (ref 3.5–5.1)
Sodium: 138 mmol/L (ref 135–145)
Total Bilirubin: 0.9 mg/dL (ref 0.3–1.2)
Total Protein: 5.7 g/dL — ABNORMAL LOW (ref 6.5–8.1)

## 2015-09-11 MED ORDER — PALONOSETRON HCL INJECTION 0.25 MG/5ML
0.2500 mg | Freq: Once | INTRAVENOUS | Status: AC
  Administered 2015-09-11: 0.25 mg via INTRAVENOUS
  Filled 2015-09-11: qty 5

## 2015-09-11 MED ORDER — SODIUM CHLORIDE 0.9 % IV SOLN
INTRAVENOUS | Status: DC
  Administered 2015-09-11: 11:00:00 via INTRAVENOUS

## 2015-09-11 MED ORDER — POTASSIUM CHLORIDE CRYS ER 20 MEQ PO TBCR
60.0000 meq | EXTENDED_RELEASE_TABLET | Freq: Once | ORAL | Status: AC
  Administered 2015-09-11: 60 meq via ORAL
  Filled 2015-09-11: qty 3

## 2015-09-11 MED ORDER — SODIUM CHLORIDE 0.9 % IV SOLN
10.0000 mg | Freq: Once | INTRAVENOUS | Status: AC
  Administered 2015-09-11: 10 mg via INTRAVENOUS
  Filled 2015-09-11: qty 1

## 2015-09-11 MED ORDER — LORAZEPAM 2 MG/ML IJ SOLN
0.5000 mg | Freq: Once | INTRAMUSCULAR | Status: AC
  Administered 2015-09-11: 0.5 mg via INTRAVENOUS
  Filled 2015-09-11: qty 1

## 2015-09-11 MED ORDER — LORAZEPAM 0.5 MG PO TABS: 0.5000 mg | ORAL_TABLET | Freq: Three times a day (TID) | ORAL | Status: AC

## 2015-09-11 MED ORDER — HEPARIN SOD (PORK) LOCK FLUSH 100 UNIT/ML IV SOLN
INTRAVENOUS | Status: AC
  Filled 2015-09-11: qty 5

## 2015-09-11 MED ORDER — HEPARIN SOD (PORK) LOCK FLUSH 100 UNIT/ML IV SOLN
500.0000 [IU] | Freq: Once | INTRAVENOUS | Status: AC
  Administered 2015-09-11: 500 [IU] via INTRAVENOUS

## 2015-09-11 NOTE — Progress Notes (Signed)
Julie Ponto, MD [place] [address]  Colon cancer metastasized to multiple sites [facility]) - Plan: LORazepam (ATIVAN) 0.5 MG tablet  Intractable vomiting with nausea, unspecified vomiting type - Plan: LORazepam (ATIVAN) 0.5 MG tablet  CURRENT THERAPY: FOLFIRI beginning on 09/02/2015  INTERVAL HISTORY: Julie Galvan 65 y.o. 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    Colon cancer metastasized to multiple sites [facility])   08/18/2011 Initial Diagnosis Status post appendectomy.  Cancer found within specimen.  Underwent followup open right proximal colectomy for per 2013.  T3 N0.  0/16 lymph nodes positive.  Declined followup.   10/18/2013 Imaging CT abd/pelvis- severe right-sided hydronephrosis    Survivorship Found to have hydronephrosis and masses in the pelvis very suspicious for metastatic disease.  Recommendation made for chemotherapy.   10/31/2013 PET scan Multiple hypermetabolic soft tissue lesions scattered through the lower mesentery and bilateral pelvis, consistent with metastatic disease.   11/26/2013 - 06/18/2014 Chemotherapy FOLFOX + Avastin by Dr. Jacquiline Doe at [place]   07/01/2014 - 07/15/2014 Radiation Therapy Dr. Tammi Klippel   11/13/2014 - 12/09/2014 Radiation Therapy 28 Gy in 10 fractions to residula pelvic lymph nodes by Dr. Tammi Klippel   08/04/2015 [facility] Admission SBO   08/04/2015 Tumor Marker CEA: 28.4 (H)    08/04/2015 Imaging CT abd/pelvis- Distal small bowel obstruction at the level of the right pelvic sidewall tumor implant, which is decreased in size since 07/24/2014. Probable new colo-enteric fistula between the distal sigmoid  colon and a right pelvic small bowel loop.    08/06/2015 Imaging CT chest- Scattered pulmonary nodules and distal periaortic adenopathy, new from 10/31/2013, indicative of metastatic disease.   08/22/2015 Treatment Plan Change Consult with Dr. Dell Galvan: e discussed that as her disease is not contained to the peritoneum, she is not a candidate for HIPEC at this time. We recommend to continue chemotherapy for this but should avoid any further radiation therapy.    Chemotherapy FOLFIRI    TODAY IS A NADIR CHECK.  I personally reviewed and went over laboratory results with the patient.  The results are noted within this dictation. Labs will be updated today.  Hypokalemia Is noted today and will be corrected your the clinic.  She was scheduled to see me early this morning but unfortunately had an episode of severe nausea with vomiting last night through the morning hours. As result, the patient reported that she "just wanted to die." The patient's husband was able to get in touch with [place] early this morning and we've asked them to come in for IV fluids and IV antiemetics. He did arrive to the clinic and interventions were started. I saw her after lunch today. She was found to sleep and resting comfortably. Her husband provided patient's history over the past week. He notes that she's been eating well all throughout the week. He notes that she was feeling well too.  All of a sudden yesterday evening she became nauseated and had an episode of vomiting. He notes that lasted throughout the night with her vomiting every hour. Interestingly, he notes that yesterday he had some diarrhea which he thinks is from a pizza that he and the patient shared the day  prior.   She was seen by Dr. Johney Maine at [facility] for a surgical opinion.  His A/P follows:  ASSESSMENT/PLAN: Julie Galvan is a 65 y.o. female who presents today to the [facility] for the first time for an assessment of peritoneal carcinomatosis with  colon adenocarcinoma primary. We discussed the implications of this diagnosis at length and in detail. We discussed that as her disease is not contained to the peritoneum, she is not a candidate for HIPEC at this time. We recommend to continue chemotherapy for this but should avoid any further radiation therapy. She will follow up in clinic as needed.   Past Medical History  Diagnosis Date  . Hypertension   . Heart palpitations     intermittent" especially if forgets to take Metoprolol."  . Anxiety   . H/O seasonal allergies 11-20-13    "causes coughing"  . Chronic kidney disease     hx. of kidney stones and right ureteral stent placed due to mestatic cancer  . GERD (gastroesophageal reflux disease)   . Arthritis     fingers  . Colon cancer metastasized to multiple sites [facility])     Cecum [id number]  . Peritoneal carcinomatosis [facility])     has Colon cancer metastasized to multiple sites [facility]); SBO (small bowel obstruction) ([place]); Volume depletion; Weight loss; Nausea & vomiting; Abdominal pain; Colitis; Colo-enteric fistula; Peritoneal carcinomatosis ([place]); Malnutrition of moderate degree; and Infection of urinary tract on her problem list.     is allergic to sulfa antibiotics.  Current Outpatient Prescriptions on File Prior to Visit  Medication Sig Dispense Refill  . diphenoxylate-atropine (LOMOTIL) 2.5-0.025 MG tablet May take 1-2 tablets four times a day as needed for loose stools. 60 tablet 0  . feeding supplement (BOOST / RESOURCE BREEZE) LIQD Take 1 Container by mouth 3 (three) times daily between meals. (Patient taking differently: Take 1 Container by mouth daily. Using regular boost) 10 Container 10  . FLUOROURACIL IV Inject into the vein. Every 14 days. To be administered over 46 hours every 14 days.    Burnadette Peter OIL Take 1 capsule by mouth every 3 (three) days.     . IRINOTECAN HCL IV Inject into the vein. Every 14 days    . LEUCOVORIN CALCIUM IV Inject into the vein. Every 14  days    . lidocaine-prilocaine (EMLA) cream Apply topically.    . metoprolol tartrate (LOPRESSOR) 25 MG tablet Take 25 mg by mouth 2 (two) times daily.    . ondansetron (ZOFRAN ODT) 8 MG disintegrating tablet Take 1 tablet (8 mg total) by mouth every 8 (eight) hours as needed for nausea or vomiting. 30 tablet 1  . Oxycodone HCl 10 MG TABS Take 10 mg by mouth every 6 (six) hours as needed.  0  . Pegfilgrastim (NEULASTA [place]) Inject into the skin. To be administered every 14 days at the completion of chemo.    . polyethylene glycol (MIRALAX / GLYCOLAX) packet Take 17 g by mouth daily as needed.    . prochlorperazine (COMPAZINE) 10 MG tablet Take 1 tablet (10 mg total) by mouth every 6 (six) hours as needed for nausea or vomiting. 30 tablet 2  . ranitidine (ZANTAC) 150 MG tablet Take 150 mg by mouth 2 (two) times daily.     Current Facility-Administered Medications on File Prior to Visit  Medication Dose Route Frequency Provider Last Rate Last Dose  . 0.9 %  sodium chloride infusion   Intravenous Continuous Baird Cancer, PA-C  Stopped at 09/11/15 1421  . sodium chloride flush (NS) 0.9 % injection 10 mL  10 mL Intracatheter PRN Patrici Ranks, MD   10 mL at 09/02/15 1019    Past Surgical History  Procedure Laterality Date  . Ureteral stent placement Right 11-21-13    done at [facility]  . Appendectomy  Jan 2013    1'13 dx. cancer  . Colon surgery Right 08/18/2011    colon resection for cancer and agin for obstruction  . Abdominal hysterectomy    . Breast surgery      left breast lumpectomy-benign-40 yrs ago  . Portacath placement N/A 11/22/2013    Procedure: INSERTION PORT-A-CATH LEFT SUBCLAVIAN VEIN;  Surgeon: Shann Medal, MD;  Location: [facility]S;  Service: General;  Laterality: N/A;    Denies any headaches, dizziness, double vision, fevers, chills, night sweats, constipation, chest pain, heart palpitations, shortness of breath, blood in stool, black tarry stool, hematuria.     PHYSICAL EXAMINATION  ECOG PERFORMANCE STATUS: 1 - Symptomatic but completely ambulatory  There were no vitals filed for this visit. Vitals are in the flow sheet  GENERAL: Asleep in a chemotherapy bed resting comfortably. Accompanied by her husband. SKIN: skin color, texture, turgor are normal, no rashes or significant lesions HEAD: Normocephalic, No masses, lesions, tenderness or abnormalities EYES: Not examined EARS: External ears normal OROPHARYNX: Not examined NECK: supple, trachea midline LYMPH:  not examined BREAST:not examined LUNGS: Not examined  HEART: Not examined ABDOMEN: Not examined  BACK: Back symmetric, no curvature. EXTREMITIES:less then 2 second capillary refill, no skin discoloration, no cyanosis  NEURO: alert & oriented x 3 with fluent speech, no focal motor/sensory deficits   LABORATORY DATA: CBC    Component Value Date/Time   WBC 4.9 09/11/2015 1131   RBC 3.72* 09/11/2015 1131   HGB 10.2* 09/11/2015 1131   HCT 31.0* 09/11/2015 1131   PLT 148* 09/11/2015 1131   MCV 83.3 09/11/2015 1131   MCH 27.4 09/11/2015 1131   MCHC 32.9 09/11/2015 1131   RDW 13.8 09/11/2015 1131   LYMPHSABS 0.7 09/11/2015 1131   MONOABS 1.0 09/11/2015 1131   EOSABS 0.0 09/11/2015 1131   BASOSABS 0.0 09/11/2015 1131      Chemistry      Component Value Date/Time   NA 138 09/11/2015 1131   K 3.0* 09/11/2015 1131   CL 96* 09/11/2015 1131   CO2 32 09/11/2015 1131   BUN 10 09/11/2015 1131   CREATININE 0.73 09/11/2015 1131      Component Value Date/Time   CALCIUM 8.4* 09/11/2015 1131   ALKPHOS 55 09/11/2015 1131   AST 8* 09/11/2015 1131   ALT 8* 09/11/2015 1131   BILITOT 0.9 09/11/2015 1131     Lab Results  Component Value Date   CEA 19.5* 09/02/2015      PENDING LABS:   RADIOGRAPHIC STUDIES:  US Venous Img Lower Unilateral Right  08/14/2015  CLINICAL DATA:  Right lower extremity pain and swelling for 1 week. EXAM: Right LOWER EXTREMITY VENOUS DOPPLER  ULTRASOUND TECHNIQUE: Gray-scale sonography with graded compression, as well as color Doppler and duplex ultrasound were performed to evaluate the lower extremity deep venous systems from the level of the common femoral vein and including the common femoral, femoral, profunda femoral, popliteal and calf veins including the posterior tibial, peroneal and gastrocnemius veins when visible. The superficial great saphenous vein was also interrogated. Spectral Doppler was utilized to evaluate flow at rest and with distal augmentation maneuvers in the common femoral,  femoral and popliteal veins. COMPARISON:  None. FINDINGS: Contralateral Common Femoral Vein: Respiratory phasicity is normal and symmetric with the symptomatic side. No evidence of thrombus. Normal compressibility. Common Femoral Vein: No evidence of thrombus. Normal compressibility, respiratory phasicity and response to augmentation. Saphenofemoral Junction: No evidence of thrombus. Normal compressibility and flow on color Doppler imaging. Profunda Femoral Vein: No evidence of thrombus. Normal compressibility and flow on color Doppler imaging. Femoral Vein: No evidence of thrombus. Normal compressibility, respiratory phasicity and response to augmentation. Popliteal Vein: No evidence of thrombus. Normal compressibility, respiratory phasicity and response to augmentation. Calf Veins: No evidence of thrombus. Normal compressibility and flow on color Doppler imaging. Superficial Great Saphenous Vein: No evidence of thrombus. Normal compressibility and flow on color Doppler imaging. Venous Reflux:  None. Other Findings:  None. IMPRESSION: No evidence of deep venous thrombosis. Electronically Signed   By: Marijo Sanes M.D.   On: 08/14/2015 10:48     PATHOLOGY:    ASSESSMENT AND PLAN:  Colon cancer metastasized to multiple sites [facility]) Stage IV adenocarcinoma of colon with distal small bowel obstruction secondary to malignancy with a new colo-enteric  fistula between the distal sigmoid colon and right pelvic small bowel loop and new retroperitoneal lymphadenopathy and progressive peritoneal implants; additionally, she has suspicious bilateral pulmonary nodules.  Oncology history is up to date.  Over the last 24 hours, she's had severe nausea with vomiting. She is brought in today for IV fluids and IV antiemetics. She was briefly scheduled for a nadir check today. Upon her arrival in the clinic, she is escorted to the chemotherapy area. IV access is ascertained. She is given IV fluids with 10 mg of dexamethasone, 0.5 mg of lorazepam, and 8 mg Zofran.  I am not convinced her episode of nausea with vomiting is chemotherapy given the story he gave me about having diarrhea following pizza consumption yesterday.  However, we cannot ignore that it certainly could be chemotherapy induced.  Her acute nausea/vomiting has been aborted today.  Rx provided for PO ativan 0.5 mg PRN nausea/vomiting.  Patient's husband denies any patient loose stools.  She has seen Dr. Johney Maine at [facility] for surgical oncology opinion:  ASSESSMENT/PLAN: Kentara Naro is a 65 y.o. female who presents today to the [facility] for the first time for an assessment of peritoneal carcinomatosis with colon adenocarcinoma primary. We discussed the implications of this diagnosis at length and in detail. We discussed that as her disease is not contained to the peritoneum, she is not a candidate for HIPEC at this time. We recommend to continue chemotherapy for this but should avoid any further radiation therapy. She will follow up in clinic as needed.  Patient and husband know to call the clinic first thing in the AM with any issues tomorrow.  Pre-treatment labs as ordered next week: CBC diff, CMET.  Return next week for follow-up, pre-treatment labs, and next cycle of FOLFIRI.  THERAPY PLAN:  We will can continue with FOLFIRI chemotherapy as planned.  All questions were  answered. The patient knows to call the clinic with any problems, questions or concerns. We can certainly see the patient much sooner if necessary.  Patient and plan discussed with Dr. Ancil Linsey and she is in agreement with the aforementioned.   This note is electronically signed by: Doy Mince 09/11/2015 2:26 PM

## 2015-09-11 NOTE — Assessment & Plan Note (Signed)
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  Oncology history is up to date.  Over the last 24 hours, she's had severe nausea with vomiting. She is brought in today for IV fluids and IV antiemetics. She was briefly scheduled for a nadir check today. Upon her arrival in the clinic, she is escorted to the chemotherapy area. IV access is ascertained. She is given IV fluids with 10 mg of dexamethasone, 0.5 mg of lorazepam, and 8 mg Zofran.  I am not convinced her episode of nausea with vomiting is chemotherapy given the story he gave me about having diarrhea following pizza consumption yesterday.  However, we cannot ignore that it certainly could be chemotherapy induced.  Her acute nausea/vomiting has been aborted today.  Rx provided for PO ativan 0.5 mg PRN nausea/vomiting.  Patient's husband denies any patient loose stools.  She has seen Dr. Johney Maine at [facility] for surgical oncology opinion:  ASSESSMENT/PLAN: Julie Galvan is a 65 y.o. female who presents today to the [facility] for the first time for an assessment of peritoneal carcinomatosis with colon adenocarcinoma primary. We discussed the implications of this diagnosis at length and in detail. We discussed that as her disease is not contained to the peritoneum, she is not a candidate for HIPEC at this time. We recommend to continue chemotherapy for this but should avoid any further radiation therapy. She will follow up in clinic as needed.  Patient and husband know to call the clinic first thing in the AM with any issues tomorrow.  Pre-treatment labs as ordered next week: CBC diff, CMET.  Return next week for follow-up, pre-treatment labs, and next  cycle of FOLFIRI.

## 2015-09-11 NOTE — Patient Instructions (Signed)
[place] at [facility] Discharge Instructions  RECOMMENDATIONS MADE BY THE CONSULTANT AND ANY TEST RESULTS WILL BE SENT TO YOUR REFERRING PHYSICIAN.  IV fluids and antiemetics given as ordered. You were given a Otoniel Myhand RX for ativan. You may alternate ativan with ondansetron at home.  Thank you for choosing [place] at [facility] to provide your oncology and hematology care.  To afford each patient quality time with our provider, please arrive at least 15 minutes before your scheduled appointment time.   Beginning January 23rd 2017 lab work for the [name] will be done in the  Main lab at [facility] on 1st floor. If you have a lab appointment with the [place] please come in thru the  Main Entrance and check in at the main information desk  You need to re-schedule your appointment should you arrive 10 or more minutes late.  We strive to give you quality time with our providers, and arriving late affects you and other patients whose appointments are after yours.  Also, if you no show three or more times for appointments you may be dismissed from the clinic at the providers discretion.     Again, thank you for choosing [facility].  Our hope is that these requests will decrease the amount of time that you wait before being seen by our physicians.       _____________________________________________________________  Should you have questions after your visit to [facility], please contact our office at [phone] between the hours of 8:30 a.m. and 4:30 p.m.  Voicemails left after 4:30 p.m. will not be returned until the following business day.  For prescription refill requests, have your pharmacy contact our office.         Resources For Cancer Patients and their Caregivers ? American Cancer Society: Can assist with transportation, wigs, general needs, runs Look Good Feel Better.         715-823-6300 ? Cancer Care: Provides financial assistance, online support groups, medication/co-pay assistance.  1-800-813-HOPE 267-544-9779) ? [place] Assists [place] cancer patients and their families through emotional , educational and financial support.  (301) 156-6329 ? [place] DSS Where to apply for food stamps, Medicaid and utility assistance. 516-458-6895 ? [employer]: Transportation to medical appointments. (818) 079-7164 ? Social Security Administration: May apply for disability if have a Stage IV cancer. (769) 563-5268 (248)430-7336 ? [employer], Disability and Transit Services: Assists with nutrition, care and transit needs. 216-769-4169

## 2015-09-11 NOTE — Progress Notes (Signed)
PRN treatment visit, nausea and vomiting unrelieved with antiemetics at home.  IV fluid and medications given as ordered. Patient slept for duration of time in clinic.  On discharge home with husband denies complaints of nausea. Stable at discharge.

## 2015-09-12 ENCOUNTER — Encounter (HOSPITAL_COMMUNITY): Payer: Self-pay

## 2015-09-12 ENCOUNTER — Other Ambulatory Visit (HOSPITAL_COMMUNITY): Payer: Self-pay | Admitting: Oncology

## 2015-09-16 ENCOUNTER — Encounter (HOSPITAL_BASED_OUTPATIENT_CLINIC_OR_DEPARTMENT_OTHER): Payer: BLUE CROSS/BLUE SHIELD

## 2015-09-16 ENCOUNTER — Encounter (HOSPITAL_COMMUNITY): Payer: Self-pay | Admitting: Hematology & Oncology

## 2015-09-16 ENCOUNTER — Encounter (HOSPITAL_COMMUNITY): Payer: BLUE CROSS/BLUE SHIELD

## 2015-09-16 ENCOUNTER — Encounter (HOSPITAL_BASED_OUTPATIENT_CLINIC_OR_DEPARTMENT_OTHER): Payer: BLUE CROSS/BLUE SHIELD | Admitting: Hematology & Oncology

## 2015-09-16 VITALS — BP 111/61 | HR 126 | Temp 98.4°F | Resp 18 | Wt 102.3 lb

## 2015-09-16 DIAGNOSIS — Z7189 Other specified counseling: Secondary | ICD-10-CM

## 2015-09-16 DIAGNOSIS — Z66 Do not resuscitate: Secondary | ICD-10-CM

## 2015-09-16 DIAGNOSIS — C189 Malignant neoplasm of colon, unspecified: Secondary | ICD-10-CM

## 2015-09-16 DIAGNOSIS — R112 Nausea with vomiting, unspecified: Secondary | ICD-10-CM | POA: Diagnosis not present

## 2015-09-16 DIAGNOSIS — R110 Nausea: Secondary | ICD-10-CM

## 2015-09-16 DIAGNOSIS — R911 Solitary pulmonary nodule: Secondary | ICD-10-CM | POA: Diagnosis not present

## 2015-09-16 DIAGNOSIS — C786 Secondary malignant neoplasm of retroperitoneum and peritoneum: Secondary | ICD-10-CM

## 2015-09-16 DIAGNOSIS — R109 Unspecified abdominal pain: Secondary | ICD-10-CM

## 2015-09-16 DIAGNOSIS — N135 Crossing vessel and stricture of ureter without hydronephrosis: Secondary | ICD-10-CM

## 2015-09-16 MED ORDER — HEPARIN SOD (PORK) LOCK FLUSH 100 UNIT/ML IV SOLN
500.0000 [IU] | Freq: Once | INTRAVENOUS | Status: AC
  Administered 2015-09-16: 500 [IU] via INTRAVENOUS

## 2015-09-16 MED ORDER — PALONOSETRON HCL INJECTION 0.25 MG/5ML
0.2500 mg | Freq: Once | INTRAVENOUS | Status: AC
  Administered 2015-09-16: 0.25 mg via INTRAVENOUS

## 2015-09-16 MED ORDER — PALONOSETRON HCL INJECTION 0.25 MG/5ML
INTRAVENOUS | Status: AC
  Filled 2015-09-16: qty 5

## 2015-09-16 MED ORDER — SODIUM CHLORIDE 0.9 % IV SOLN
INTRAVENOUS | Status: DC
  Administered 2015-09-16: 10:00:00 via INTRAVENOUS

## 2015-09-16 NOTE — Patient Instructions (Signed)
[place] at [facility] Discharge Instructions  RECOMMENDATIONS MADE BY THE CONSULTANT AND ANY TEST RESULTS WILL BE SENT TO YOUR REFERRING PHYSICIAN.  IV fluids today.   Please see MD instructions for more information.    Thank you for choosing [place] at [facility] to provide your oncology and hematology care.  To afford each patient quality time with our provider, please arrive at least 15 minutes before your scheduled appointment time.   Beginning January 23rd 2017 lab work for the [name] will be done in the  Main lab at [facility] on 1st floor. If you have a lab appointment with the [place] please come in thru the  Main Entrance and check in at the main information desk  You need to re-schedule your appointment should you arrive 10 or more minutes late.  We strive to give you quality time with our providers, and arriving late affects you and other patients whose appointments are after yours.  Also, if you no show three or more times for appointments you may be dismissed from the clinic at the providers discretion.     Again, thank you for choosing [facility].  Our hope is that these requests will decrease the amount of time that you wait before being seen by our physicians.       _____________________________________________________________  Should you have questions after your visit to [facility], please contact our office at [phone] between the hours of 8:30 a.m. and 4:30 p.m.  Voicemails left after 4:30 p.m. will not be returned until the following business day.  For prescription refill requests, have your pharmacy contact our office.         Resources For Cancer Patients and their Caregivers ? American Cancer Society: Can assist with transportation, wigs, general needs, runs Look Good Feel Better.        [phone] ? Cancer Care: Provides financial assistance, online support  groups, medication/co-pay assistance.  1-800-813-HOPE 240-803-4625) ? [place] Assists [place] cancer patients and their families through emotional , educational and financial support.  (575) 298-8361 ? [place] DSS Where to apply for food stamps, Medicaid and utility assistance. 782-162-1021 ? [employer]: Transportation to medical appointments. 215-775-5773 ? Social Security Administration: May apply for disability if have a Stage IV cancer. 641-131-6197 435 881 4333 ? [employer], Disability and Transit Services: Assists with nutrition, care and transit needs. 331-584-4550

## 2015-09-16 NOTE — Progress Notes (Signed)
[name], MD [address] 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  SUMMARY OF ONCOLOGIC HISTORY:   Colon cancer metastasized to multiple sites [facility])   08/18/2011 Initial Diagnosis Status post appendectomy.  Cancer found within specimen.  Underwent followup open right proximal colectomy for per 2013.  T3 N0.  0/16 lymph nodes positive.  Declined followup.   10/18/2013 Imaging CT abd/pelvis- severe right-sided hydronephrosis    Survivorship Found to have hydronephrosis and masses in the pelvis very suspicious for metastatic disease.  Recommendation made for chemotherapy.   10/31/2013 PET scan Multiple hypermetabolic soft tissue lesions scattered through the lower mesentery and bilateral pelvis, consistent with metastatic disease.   11/26/2013 - 06/18/2014 Chemotherapy FOLFOX + Avastin by Dr. Jacquiline Doe at [place]   07/01/2014 - 07/15/2014 Radiation Therapy Dr. Tammi Klippel   11/13/2014 - 12/09/2014 Radiation Therapy 28 Gy in 10 fractions to residula pelvic lymph nodes by Dr. Tammi Klippel   08/04/2015 [facility] Admission SBO   08/04/2015 Tumor Marker CEA: 28.4 (H)    08/04/2015 Imaging CT abd/pelvis- Distal small bowel obstruction at the level of the right pelvic sidewall tumor implant, which is decreased in size since 07/24/2014. 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    08/06/2015 Imaging CT chest- Scattered pulmonary nodules and distal periaortic adenopathy, new from 10/31/2013, indicative of metastatic disease.   08/22/2015 Treatment Plan Change Consult with Dr. Dell Ponto: e discussed that as her disease is not contained to the peritoneum, she is not a candidate  for HIPEC at this time. We recommend to continue chemotherapy for this but should avoid any further radiation therapy.   09/02/2015 -  Chemotherapy FOLFIRI   09/11/2015 Pathology Results PDL1- 0% (NEGATIVE)    CURRENT THERAPY: Observation  INTERVAL HISTORY: Julie Galvan 65 y.o. female returns for Stage IV adenocarcinoma of colon.   Julie Galvan is a 65 yo white American female with a past medical history significant for Stage IV well-differentiated adenocarcinoma of colon, KRAS-positive, (with documentation of appendiceal carcinoma from outside Medical oncology dictations) initially presenting with Stage II disease managed surgically and then found to have with bulky pelvic disease with a history of severe right hydronephrosis secondary to a right pelvic mass S/P FOLFOX + Avastin by Dr. Jacquiline Doe last treated in December 2015 and discontinued due to intolerance. Since then, she has undergone palliative radiation by Dr. Tammi Klippel. EXACT DETAILS ARE UNKNOWN.  According to the patient, she underwent an appendectomy in 2013 and at that time, she reports that her surgeon was suspicious for malignancy given inspection of the removed appendix. She notes that pathology was positive for disease which lead to a colonoscopy and further colon resection for adenocarcinoma. She notes that 5 lymph nodes were removed at time of this surgery and all were negative for metastatic disease. No further therapy was performed at that time according to the patient. She notes that in 2015, her primary care provider was suspicious about a finding on physical exam which lead to an [place] of pelvis. This was subsequently followed by a CT abd/pelvis and then a PET scan. PET scan in May 2015 demonstrated metastatic disease with multiple soft tissue lesions that were hypermetabolic through the lower  mesentery and bilateral pelvis. She notes that she was subsequently treated with chemotherapy. She was treated with systemic chemotherapy  consisting of FOLFOX + Avastin. She reports that she was nauseated and suffered with severe fatigue and tiredness. As a result, she stopped therapy and was last treated in December of 2015. She was then followed by Dr. Jacquiline Doe for a short time and then failed to continue to follow with him with last appointment documented in [place] was on 10/04/2014 with a requested 3 months follow-up appointment according to his dictation, but no more recent documentation. She has been treated with palliative radiation by Dr. Tammi Klippel, most recent treatment to residual pelvic lymph nodes in 10 fractions.  Julie Galvan was accompanied by her husband and sister. Was transported in a wheelchair. She received her first cycle of FOLFIRI.  She has had persistent nausea and vomiting for the past 2 weeks. She has thrown up 1 or 2 times a day. Her husband said that she cannot eat and every time she does it comes back up but she said that it is not that bad. She said that she does not have a good appetite. She has been eating Jello. She said that if she can just find something to eat it it might get better. Her husband asked what he should be feeding her. She came in last week for IVF.   She has lost 9 pounds in 2 weeks.   She is tired and weak and says that she has felt this way since she started treatment and that this is why she is sick.   We discussed stopping her chemotherapy and getting her hospice care.  She and her husband are resistant to use hospice. Her husband said that he thought that hospice is where you just take your pain pills and they let you die.   She and her husband have not really talked about death and dying and that they have been planning on doing a will for the past 40 years. She does not want to be on a ventilator and filled out the paperwork for that today.  She said that she has accepted that she has stage IV colon cancer and that she is going to die. She says that she just needs some  sunshine and wants to spend time with her family. She still needs to see her grandchildren grow.   MEDICAL HISTORY: Past Medical History  Diagnosis Date  . Hypertension   . Heart palpitations     intermittent" especially if forgets to take Metoprolol."  . Anxiety   . H/O seasonal allergies 11-20-13    "causes coughing"  . Chronic kidney disease     hx. of kidney stones and right ureteral stent placed due to mestatic cancer  . GERD (gastroesophageal reflux disease)   . Arthritis     fingers  . Colon cancer metastasized to multiple sites [facility])     Cecum T3N0M1  . 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     is allergic to sulfa antibiotics.  @MEDADMINPROSE @  SURGICAL HISTORY: Past Surgical History  Procedure Laterality Date  . Ureteral stent placement Right 11-21-13    done at [facility]  . Appendectomy  Jan 2013    1'13 dx. cancer  .  Colon surgery Right 08/18/2011    colon resection for cancer and agin for obstruction  . Abdominal hysterectomy    . Breast surgery      left breast lumpectomy-benign-40 yrs ago  . Portacath placement N/A 11/22/2013    Procedure: INSERTION PORT-A-CATH LEFT SUBCLAVIAN VEIN;  Surgeon: Shann Medal, MD;  Location: [facility];  Service: General;  Laterality: N/A;    SOCIAL HISTORY: Social History   Social History  . Marital Status: Married    Spouse Name: N/A  . Number of Children: N/A  . Years of Education: N/A   Occupational History  . Not on file.   Social History Main Topics  . Smoking status: Former Smoker    Types: Cigarettes    Quit date: 09/21/2013  . Smokeless tobacco: Not on file  . Alcohol Use: No  . Drug Use: No  . Sexual  Activity: Not on file   Other Topics Concern  . Not on file   Social History Narrative    FAMILY HISTORY: Family History  Problem Relation Age of Onset  . Cancer Mother   . Stroke Father     Review of Systems  Constitutional: Positive for weight loss and malaise/fatigue. Negative for fever and chills.       Tired and weak Lost 9 pounds in 2 weeks.  HENT: Negative.  Negative for congestion, hearing loss, nosebleeds, sore throat and tinnitus.   Eyes: Negative.  Negative for blurred vision, double vision, pain and discharge.  Respiratory: Negative.  Negative for cough, hemoptysis, sputum production, shortness of breath and wheezing.   Cardiovascular: Negative for chest pain, palpitations, claudication and PND.       Right leg swelling.  Gastrointestinal: Positive for nausea and vomiting. Negative for heartburn, diarrhea, constipation, blood in stool and melena.       Persistent  nausea and vomiting. 1 to 2 times a day. Cannot keep food down.  Genitourinary: Negative.  Negative for dysuria, urgency, frequency and hematuria.  Musculoskeletal: Negative.  Negative for myalgias, joint pain and falls.  Skin: Negative.  Negative for itching and rash.  Neurological: Negative.  Negative for dizziness, tingling, tremors, sensory change, speech change, focal weakness, seizures, loss of consciousness, weakness and headaches.  Endo/Heme/Allergies: Negative.  Does not bruise/bleed easily.  Psychiatric/Behavioral: Negative.  Negative for depression, suicidal ideas, memory loss and substance abuse. The patient is not nervous/anxious and does not have insomnia.   All other systems reviewed and are negative.   PHYSICAL EXAMINATION  ECOG PERFORMANCE STATUS: 1 - Symptomatic but completely ambulatory  Filed Vitals:   09/16/15 0900  BP: 111/61  Pulse: 126  Temp: 98.4 F (36.9 C)  Resp: 18   Vitals with BMI 09/16/2015  Weight 102 lbs 5 oz   Vitals with BMI 09/02/2015  Weight 111 lbs 6 oz     Physical Exam  Constitutional: She is oriented to person, place, and time.  Weak appearing, falls asleep periodically during conversation  HENT:  Head: Normocephalic and atraumatic.  Nose: Nose normal.  Mouth/Throat: Oropharynx is clear and moist. No oropharyngeal exudate.  Eyes: Conjunctivae and EOM are normal. Pupils are equal, round, and reactive to light. Right eye exhibits no discharge. Left eye exhibits no discharge. No scleral icterus.  Neck: Normal range of motion. Neck supple. No tracheal deviation present. No thyromegaly present.  Cardiovascular: Normal rate, regular rhythm and normal heart sounds.  Exam reveals no gallop and no friction rub.   No murmur heard. Pulmonary/Chest: Effort normal and  breath sounds normal. She has no wheezes. She has no rales.  Abdominal: Soft. Bowel sounds are normal. She exhibits no distension and no mass. There is tenderness. There is no rebound and no guarding.  Musculoskeletal: She exhibits no edema.  Lymphadenopathy:    She has no cervical adenopathy.  Neurological: She is oriented to person, place, and time. No cranial nerve deficit.  Skin: Skin is warm and dry.  Psychiatric: Mood, memory and affect normal.  Nursing note and vitals reviewed.   LABORATORY DATA:  I have reviewed the data as listed.  CBC    Component Value Date/Time   WBC 4.9 09/11/2015 1131   RBC 3.72* 09/11/2015 1131   HGB 10.2* 09/11/2015 1131   HCT 31.0* 09/11/2015 1131   PLT 148* 09/11/2015 1131   MCV 83.3 09/11/2015 1131   MCH 27.4 09/11/2015 1131   MCHC 32.9 09/11/2015 1131   RDW 13.8 09/11/2015 1131   LYMPHSABS 0.7 09/11/2015 1131   MONOABS 1.0 09/11/2015 1131   EOSABS 0.0 09/11/2015 1131   BASOSABS 0.0 09/11/2015 1131    CMP     Component Value Date/Time   NA 138 09/11/2015 1131   K 3.0* 09/11/2015 1131   CL 96* 09/11/2015 1131   CO2 32 09/11/2015 1131   GLUCOSE 120* 09/11/2015 1131   BUN 10 09/11/2015 1131   CREATININE 0.73 09/11/2015 1131    CALCIUM 8.4* 09/11/2015 1131   PROT 5.7* 09/11/2015 1131   ALBUMIN 2.7* 09/11/2015 1131   AST 8* 09/11/2015 1131   ALT 8* 09/11/2015 1131   ALKPHOS 55 09/11/2015 1131   BILITOT 0.9 09/11/2015 1131   GFRNONAA >60 09/11/2015 1131   GFRAA >60 09/11/2015 1131    RADIOGRAPHIC STUDIES: I have personally reviewed the radiological images as listed and agreed with the findings in the report.  Study Result     CLINICAL DATA: Staging metastatic colon cancer, weight loss, vomiting and diarrhea.  EXAM: CT CHEST WITHOUT CONTRAST  TECHNIQUE: Multidetector CT imaging of the chest was performed following the standard protocol without IV contrast.  COMPARISON: CT abdomen pelvis 08/04/2015 and PET 10/31/2013.  FINDINGS: Mediastinum/Nodes: Left subclavian Port-A-Cath terminates in the SVC. No pathologically enlarged mediastinal or axillary lymph nodes. Hilar regions are difficult to definitively evaluate without IV contrast. Heart size normal. Left anterior descending coronary artery calcification. No pericardial effusion. Lymph node adjacent to the lower descending thoracic aorta measures 9 mm.  Lungs/Pleura: Scattered pulmonary nodules appear new from 10/31/2013. Index nodule in the subpleural right lower lobe measures 7 x 12 mm (series 3, image 40). No pleural fluid. Airway is unremarkable.  Upper abdomen: Hepatic and left renal lesions, as well as retroperitoneal adenopathy, better seen on diagnostic examination of 08/04/2015.  Musculoskeletal: Scattered sclerotic lesions in the spine appear to have been present on 10/31/2013. Degenerative changes are seen in the spine as well.  IMPRESSION: 1. Scattered pulmonary nodules and distal periaortic adenopathy, new from 10/31/2013, indicative of metastatic disease. 2. Retroperitoneal adenopathy better seen on 08/04/2015. 3. Left anterior descending coronary artery calcification.   Electronically Signed  By: Lorin Picket M.D.  On: 08/06/2015 09:23    Study Result      CLINICAL DATA: 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  EXAM: CT ABDOMEN AND PELVIS WITH CONTRAST  TECHNIQUE: Multidetector CT imaging of the abdomen and pelvis was performed using the standard protocol following bolus administration of intravenous contrast.  CONTRAST: 119m OMNIPAQUE  IOHEXOL 300 MG/ML SOLN  COMPARISON: 07/24/2014 CT abdomen/pelvis.  FINDINGS: Lower chest: No significant pulmonary nodules or acute consolidative airspace disease.  Hepatobiliary: Stable 3.4 cm segment 4A, 1.2 cm segment 4B and 0.9 cm segment 6 liver hemangiomas, all unchanged since 07/05/2011. No new liver masses. Stable focal adenomyomatosis in the gallbladder fundus. Stable 0.5 cm dense focus in the dependent gallbladder wall, either a polyp or adherent stone. Otherwise no gallbladder wall thickening or pericholecystic fluid. No biliary ductal dilatation.  Pancreas: Normal, with no mass or duct dilation.  Spleen: Normal size. No mass.  Adrenals/Urinary Tract: Normal adrenals. No hydronephrosis. Right percutaneous nephrostomy tube is stable in position with the distal pigtail within the right renal pelvis. Expected small amount of gas in the right renal collecting system from instrumentation. Stable asymmetric atrophy of the right kidney. Simple 1.5 cm renal cyst in the posterior upper left kidney. There is dilatation of the lumbar segment of the right ureter. Normal bladder.  Stomach/Bowel: Grossly normal stomach. There is moderate dilatation of the proximal and mid small bowel to the level of the right pelvic small bowel, with a focal small bowel caliber transition in the right pelvis (series 2/ image 45 and series 3/ image 42), which is adjacent to the 3.0 x 1.8 cm right pelvic sidewall heterogeneous mass  (series 2/ image 51), which is decreased in size from 6.9 x 5.9 cm on 07/24/2014. 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 Stable postsurgical changes in the right abdomen status post ileocecal resection with stable appearance of the neo ileocolic anastomosis. Circumferential wall thickening in the mid to distal sigmoid colon is nonspecific and most suggestive of colitis.  Vascular/Lymphatic: Atherosclerotic nonaneurysmal abdominal aorta. Patent portal, splenic, hepatic and renal veins. There is new mild-to-moderate aortocaval and right common iliac lymphadenopathy, for example a 1.5 cm aortocaval node (series 2/ image 25) and a 1.3 cm right common iliac node (series 2/ image 37).  Reproductive: Status post hysterectomy, with no new abnormality at the vaginal cuff.  Other: No pneumoperitoneum. Left deep pelvic heterogeneous 3.0 x 2.8 cm mass (series 2/ image 57), previously 3.9 x 2.5 cm, not definitely changed. Left anterior peritoneal 1.6 cm tumor implant adjacent to the left rectus muscle sheath (series 2/image 45), increased from 0.7 cm. Anterior right lower quadrant 2.3 x 2.1 cm peritoneal tumor implant (series 2/image 42), previously 1.0 x 1.0 cm, increased.  Musculoskeletal: No aggressive appearing focal osseous lesions. Mild degenerative changes in the visualized thoracolumbar spine.  IMPRESSION: 1. Distal small bowel obstruction at the level of the right pelvic sidewall tumor implant, which is decreased in size since 07/24/2014. 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 Wall thickening in the mid to distal sigmoid colon suggests a nonspecific sigmoid colitis. 2. New retroperitoneal and right common iliac lymphadenopathy, probably metastatic . 3. Interval growth of peritoneal tumor implants in the anterior right  lower quadrant and abutting the left rectus muscle sheath. Stable left pelvic side wall peritoneal tumor implant. 4. No hydronephrosis. Stable well-positioned right percutaneous nephrostomy tube. Stable asymmetric atrophy of the right kidney.   Electronically Signed  By: Ilona Sorrel M.D.  On: 08/04/2015 17:58   ASSESSMENT and THERAPY PLAN:  STAGE IV CRC Peritoneal Carcinomatosis Pulmonary Nodules SBO --RESOLVED LLE swelling R nephrostomy tube, managed at [facility] in [place] last exchanged on 06/17/2015 Prior therapy with FOLFOX/AVASTIN  The patient is very reluctant to accept death and dying. Although she would state that she understands she has stage IV colon cancer and is not afraid of death. She was very resistant to the concept of hospice. She would not accept the possibility that her abdominal pain and vomiting may be secondary to her malignancy and not just chemotherapy. Her symptoms have worsened and progressed over the last several days. Her husband also was initially resistant to hospice care. Her sister present with the couple was much more accepting.  I explained hospice and palliative medicine in detail. We again addressed the inevitability of death. We discussed the uncertainty of the timing of death. We discussed the incurability of her disease. We discussed her decline. We discussed the potential benefits that hospice/palliative medicine may provide her. I certainly do not feel she is a reasonable candidate for additional chemotherapy, even with dose reducing her.  We did make her a DO NOT RESUSCITATE today.  She was given IVF in the clinic, several other family members presented to the clinic today.  The patient and family desired hospice referral prior to departure from our care today.   Please note a total of 40 minutes was spent in direct patient consultation, advisement and care.  All questions were answered. The patient knows to call the clinic with any  problems, questions or concerns. We can certainly see the patient much sooner if necessary.  This document serves as a record of services personally performed by Ancil Linsey, MD. It was created on her behalf by Kandace Blitz, a trained medical scribe. The creation of this record is based on the scribe's personal observations and the provider's statements to them. This document has been checked and approved by the attending provider.  I have reviewed the above documentation for accuracy and completeness, and I agree with the above.  This note was electronically signed. Molli Hazard, MD  09/16/2015

## 2015-09-16 NOTE — Progress Notes (Signed)
Please see other encounter for documentation.   

## 2015-09-16 NOTE — Patient Instructions (Addendum)
[place] at [facility] Discharge Instructions  RECOMMENDATIONS MADE BY THE CONSULTANT AND ANY TEST RESULTS WILL BE SENT TO YOUR REFERRING PHYSICIAN.   Exam and discussion by Dr Whitney Muse today Hospice/pallative care is to help control your symptoms. No chemotherapy today Fluids today  Soft foods and liquids for meals  Please call the clinic if you have any questions or concerns    Thank you for choosing [place] at [facility] to provide your oncology and hematology care.  To afford each patient quality time with our provider, please arrive at least 15 minutes before your scheduled appointment time.   Beginning January 23rd 2017 lab work for the [name] will be done in the  Main lab at [facility] on 1st floor. If you have a lab appointment with the [facility] please come in thru the  Main Entrance and check in at the main information desk  You need to re-schedule your appointment should you arrive 10 or more minutes late.  We strive to give you quality time with our providers, and arriving late affects you and other patients whose appointments are after yours.  Also, if you no show three or more times for appointments you may be dismissed from the clinic at the providers discretion.     Again, thank you for choosing [facility].  Our hope is that these requests will decrease the amount of time that you wait before being seen by our physicians.       _____________________________________________________________  Should you have questions after your visit to [facility], please contact our office at [phone] between the hours of 8:30 a.m. and 4:30 p.m.  Voicemails left after 4:30 p.m. will not be returned until the following business day.  For prescription refill requests, have your pharmacy contact our office.         Resources For Cancer Patients and their Caregivers ? American [employer]  Society: Can assist with transportation, wigs, general needs, runs Look Good Feel Better.        (986)154-6189 ? Cancer Care: Provides financial assistance, online support groups, medication/co-pay assistance.  1-800-813-HOPE 432 526 7034) ? [facility] Assists [place] cancer patients and their families through emotional , educational and financial support.  (623) 453-5003 ? [place] DSS Where to apply for food stamps, Medicaid and utility assistance. 306-633-5235 ? [facility]: Transportation to medical appointments. 9858728748 ? Social Security Administration: May apply for disability if have a Stage IV cancer. 970-755-9565 539-721-9015 ? [employer], Disability and Transit Services: Assists with nutrition, care and transit needs. (548) [phone]

## 2015-09-16 NOTE — Progress Notes (Signed)
Patient tolerated infusion well.  VSS.   

## 2015-09-18 ENCOUNTER — Other Ambulatory Visit (HOSPITAL_COMMUNITY): Payer: BLUE CROSS/BLUE SHIELD

## 2015-09-22 ENCOUNTER — Encounter (HOSPITAL_COMMUNITY): Payer: Self-pay

## 2015-09-30 ENCOUNTER — Inpatient Hospital Stay (HOSPITAL_COMMUNITY): Payer: BLUE CROSS/BLUE SHIELD

## 2015-09-30 ENCOUNTER — Ambulatory Visit (HOSPITAL_COMMUNITY): Payer: BLUE CROSS/BLUE SHIELD | Admitting: Oncology

## 2015-10-02 ENCOUNTER — Other Ambulatory Visit (HOSPITAL_COMMUNITY): Payer: BLUE CROSS/BLUE SHIELD

## 2015-10-14 ENCOUNTER — Ambulatory Visit (HOSPITAL_COMMUNITY): Payer: BLUE CROSS/BLUE SHIELD | Admitting: Hematology & Oncology

## 2015-10-14 ENCOUNTER — Inpatient Hospital Stay (HOSPITAL_COMMUNITY): Payer: BLUE CROSS/BLUE SHIELD

## 2015-10-16 ENCOUNTER — Other Ambulatory Visit (HOSPITAL_COMMUNITY): Payer: BLUE CROSS/BLUE SHIELD

## 2015-10-20 DEATH — deceased

## 2015-10-28 ENCOUNTER — Inpatient Hospital Stay (HOSPITAL_COMMUNITY): Payer: BLUE CROSS/BLUE SHIELD

## 2015-10-28 ENCOUNTER — Ambulatory Visit (HOSPITAL_COMMUNITY): Payer: BLUE CROSS/BLUE SHIELD | Admitting: Oncology

## 2015-10-30 ENCOUNTER — Other Ambulatory Visit (HOSPITAL_COMMUNITY): Payer: BLUE CROSS/BLUE SHIELD

## 2015-11-11 ENCOUNTER — Inpatient Hospital Stay (HOSPITAL_COMMUNITY): Payer: BLUE CROSS/BLUE SHIELD

## 2015-11-11 ENCOUNTER — Ambulatory Visit (HOSPITAL_COMMUNITY): Payer: BLUE CROSS/BLUE SHIELD | Admitting: Hematology & Oncology

## 2015-11-13 ENCOUNTER — Other Ambulatory Visit (HOSPITAL_COMMUNITY): Payer: BLUE CROSS/BLUE SHIELD

## 2015-12-06 ENCOUNTER — Other Ambulatory Visit: Payer: Self-pay | Admitting: Nurse Practitioner

## 2015-12-22 ENCOUNTER — Other Ambulatory Visit: Payer: Self-pay | Admitting: Nurse Practitioner

## 2017-11-18 IMAGING — CT CT ABD-PELV W/ CM
2 of 4 series · 14 of 46 positions shown, 16 images · IV contrast (Omnipaque 300)
Comparison: 07/24/2014 CT abdomen/pelvis.

CLINICAL DATA: Cecal adenocarcinoma resected on 08/18/2011, status
post chemotherapy and radiation therapy, now presenting with
abdominal pain and vomiting.

EXAM:
CT ABDOMEN AND PELVIS WITH CONTRAST
TECHNIQUE: Multidetector CT imaging of the abdomen and pelvis was performed
using the standard protocol following bolus administration of
intravenous contrast.
CONTRAST:  100mL OMNIPAQUE IOHEXOL 300 MG/ML  SOLN

[Series 2: abd_pel_with 5.0 b40f · axial · 0.59mm/px · z∈[+1272,+1617]mm · 11 of 77 slices shown, 13 images]
[im 4/77  soft-tissue]
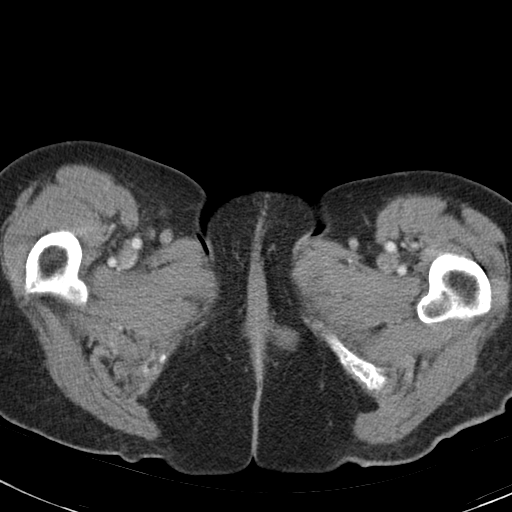
[im 4/77  bone]
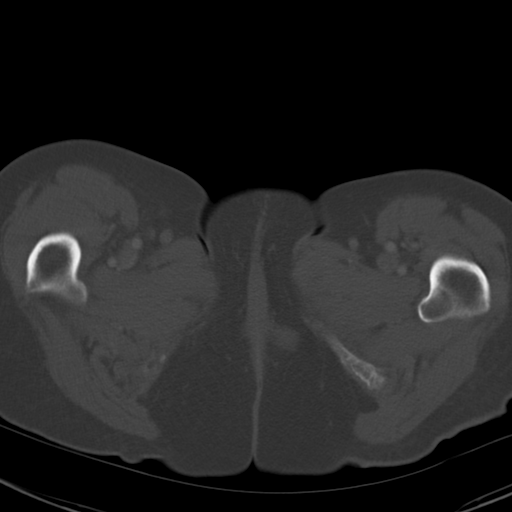
[im 11/77  soft-tissue]
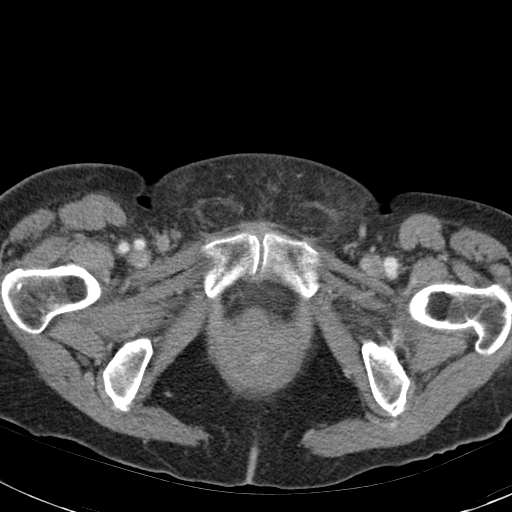
[im 18/77  soft-tissue]
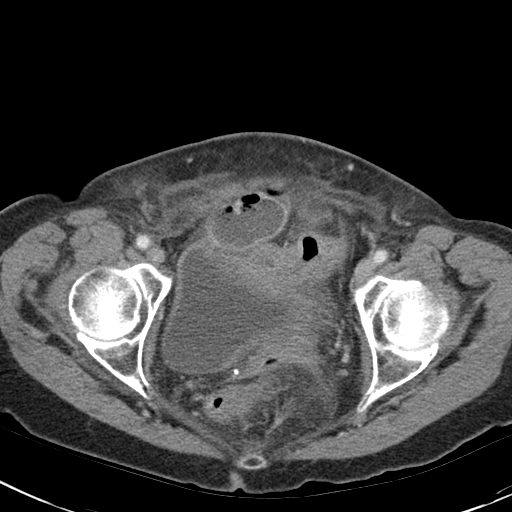
[im 25/77  soft-tissue]
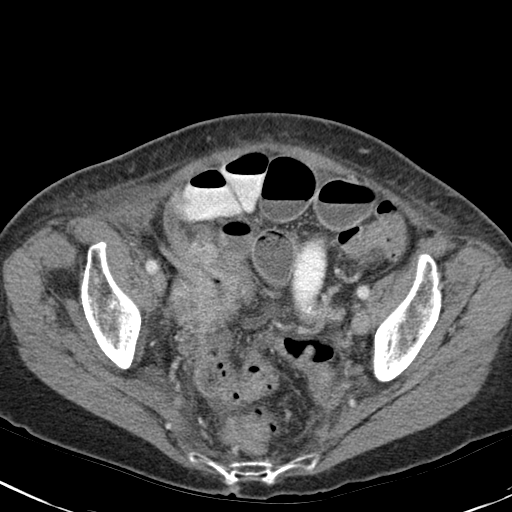
[im 32/77  soft-tissue]
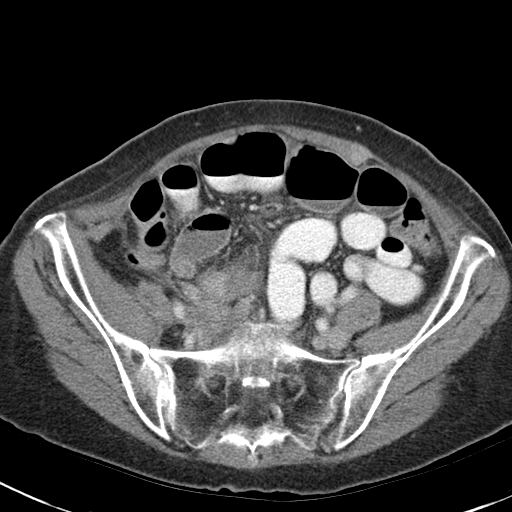
[im 39/77  soft-tissue]
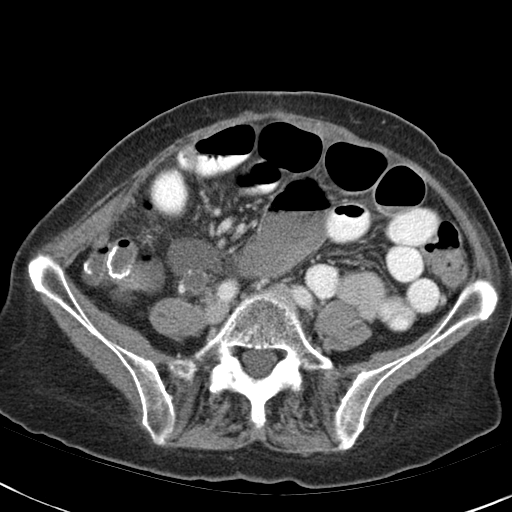
[im 45/77  soft-tissue]
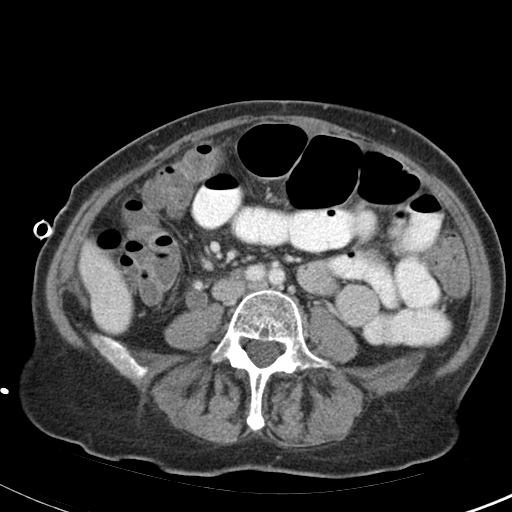
[im 52/77  soft-tissue]
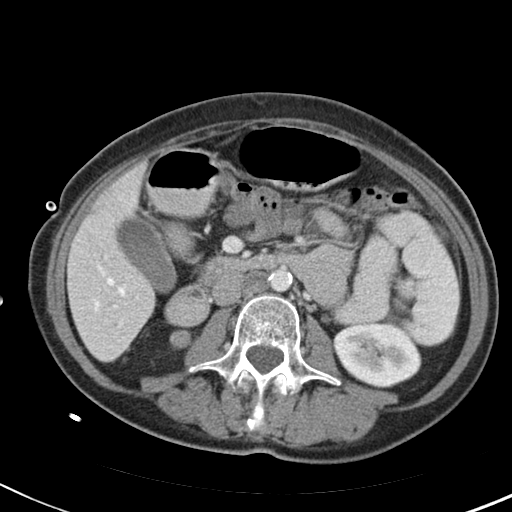
[im 59/77  soft-tissue]
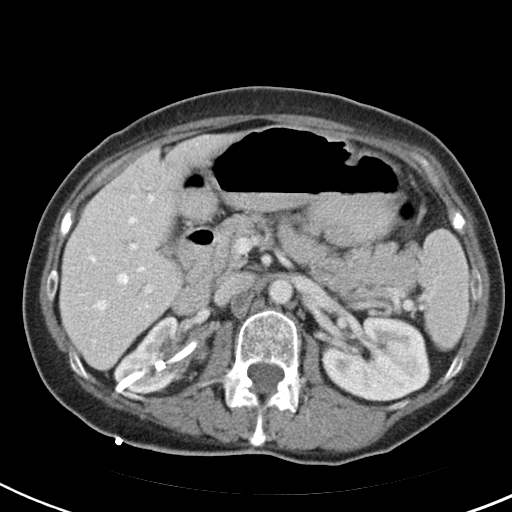
[im 59/77  bone]
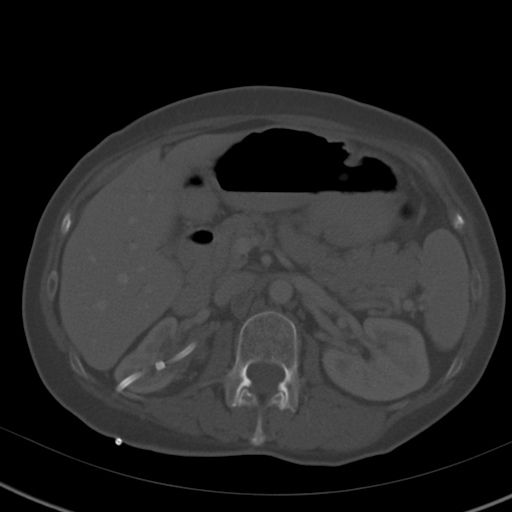
[im 66/77  soft-tissue]
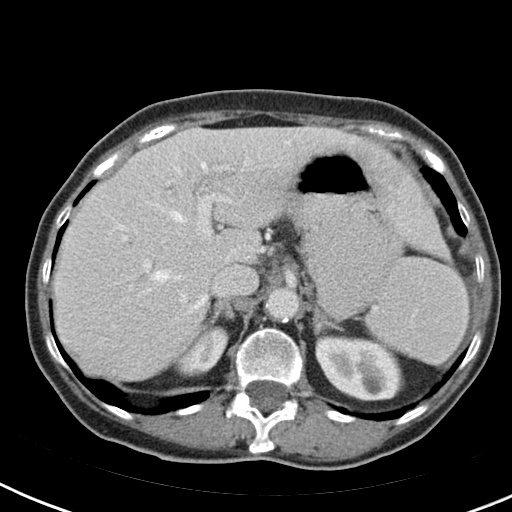
[im 73/77  soft-tissue]
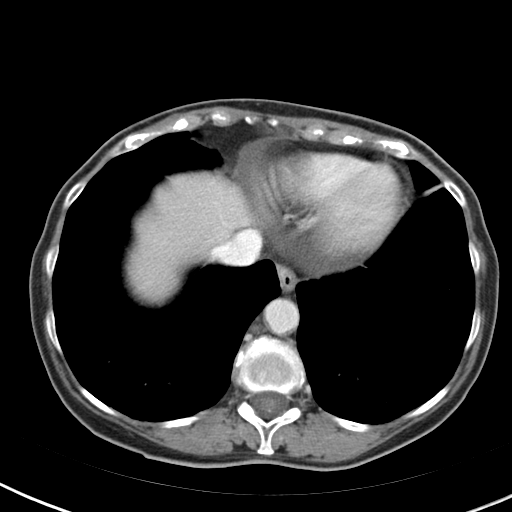

[Series 3: abd_pel_with 3.0 spo cor · coronal · 0.69mm/px · 3 of 79 slices shown]
[im 27/79  soft-tissue]
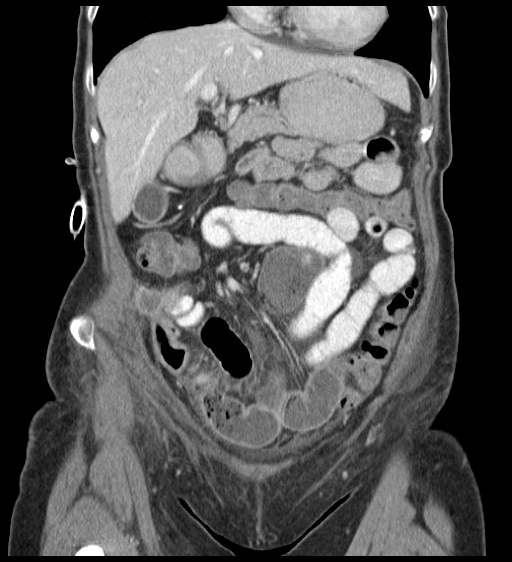
[im 35/79  soft-tissue]
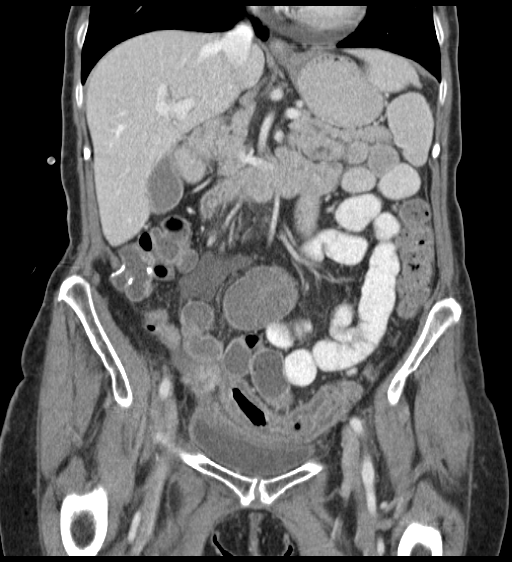
[im 44/79  soft-tissue]
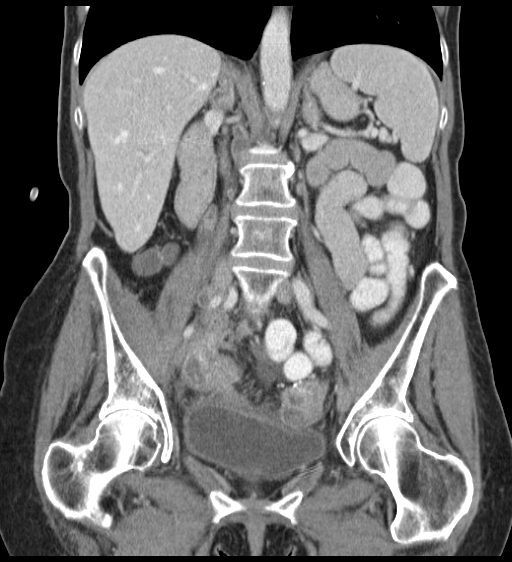

[14 of 46 positions shown; findings below may reference images not displayed]

FINDINGS: Lower chest: No significant pulmonary nodules or acute consolidative
airspace disease.

Hepatobiliary: Stable 3.4 cm segment 4A, 1.2 cm segment 4B and
cm segment 6 liver hemangiomas, all unchanged since 07/05/2011. No
new liver masses. Stable focal adenomyomatosis in the gallbladder
fundus. Stable 0.5 cm dense focus in the dependent gallbladder wall,
either a polyp or adherent stone. Otherwise no gallbladder wall
thickening or pericholecystic fluid. No biliary ductal dilatation.

Pancreas: Normal, with no mass or duct dilation.

Spleen: Normal size. No mass.

Adrenals/Urinary Tract: Normal adrenals. No hydronephrosis. Right
percutaneous nephrostomy tube is stable in position with the distal
pigtail within the right renal pelvis. Expected small amount of gas
in the right renal collecting system from instrumentation. Stable
asymmetric atrophy of the right kidney. Simple 1.5 cm renal cyst in
the posterior upper left kidney. There is dilatation of the lumbar
segment of the right ureter. Normal bladder.

Stomach/Bowel: Grossly normal stomach. There is moderate dilatation
of the proximal and mid small bowel to the level of the right pelvic
small bowel, with a focal small bowel caliber transition in the
right pelvis (series 2/ image 45 and series 3/ image 42), which is
adjacent to the 3.0 x 1.8 cm right pelvic sidewall heterogeneous
mass (series 2/ image 51), which is decreased in size from 6.9 x
cm on 07/24/2014. There is a probable new colo-enteric fistula
between the distal sigmoid colon and a right pelvic small bowel loop
(series 2/ image 54). Stable postsurgical changes in the right
abdomen status post ileocecal resection with stable appearance of
the neo ileocolic anastomosis. Circumferential wall thickening in
the mid to distal sigmoid colon is nonspecific and most suggestive
of colitis.

Vascular/Lymphatic: Atherosclerotic nonaneurysmal abdominal aorta.
Patent portal, splenic, hepatic and renal veins. There is new
mild-to-moderate aortocaval and right common iliac lymphadenopathy,
for example a 1.5 cm aortocaval node (series 2/ image 25) and a
cm right common iliac node (series 2/ image 37).

Reproductive: Status post hysterectomy, with no new abnormality at
the vaginal cuff.

Other: No pneumoperitoneum. Left deep pelvic heterogeneous 3.0 x
cm mass (series 2/ image 57), previously 3.9 x 2.5 cm, not
definitely changed. Left anterior peritoneal 1.6 cm tumor implant
adjacent to the left rectus muscle sheath (series 2/image 45),
increased from 0.7 cm. Anterior right lower quadrant 2.3 x 2.1 cm
peritoneal tumor implant (series 2/image 42), previously 1.0 x
cm, increased.

Musculoskeletal: No aggressive appearing focal osseous lesions. Mild
degenerative changes in the visualized thoracolumbar spine.
IMPRESSION: 1. Distal small bowel obstruction at the level of the right pelvic
sidewall tumor implant, which is decreased in size since 07/24/2014.
Probable new colo-enteric fistula between the distal sigmoid colon
and a right pelvic small bowel loop. Wall thickening in the mid to
distal sigmoid colon suggests a nonspecific sigmoid colitis.
2. New retroperitoneal and right common iliac lymphadenopathy,
probably metastatic .
3. Interval growth of peritoneal tumor implants in the anterior
right lower quadrant and abutting the left rectus muscle sheath.
Stable left pelvic side wall peritoneal tumor implant.
4. No hydronephrosis. Stable well-positioned right percutaneous
nephrostomy tube. Stable asymmetric atrophy of the right kidney.
